# Patient Record
Sex: Male | Born: 1990 | Race: White | Hispanic: No | Marital: Single | Smoking: Former smoker
Health system: Southern US, Community
[De-identification: ages and names within clinical notes are randomized; demographics above are authoritative.]

## PROBLEM LIST (undated history)

## (undated) DIAGNOSIS — F259 Schizoaffective disorder, unspecified: Secondary | ICD-10-CM

## (undated) DIAGNOSIS — F319 Bipolar disorder, unspecified: Secondary | ICD-10-CM

## (undated) HISTORY — PX: ROTATOR CUFF REPAIR: SHX139

---
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
## 2009-02-11 ENCOUNTER — Encounter: Admission: RE | Admit: 2009-02-11 | Discharge: 2009-05-07 | Payer: Self-pay | Admitting: Orthopedic Surgery

## 2009-02-28 IMAGING — CR DG SHOULDER 2+V*R*
2 series · 2 of 2 positions shown · non-contrast
Comparison: none

CLINICAL DATA: Right shoulder pain

RIGHT SHOULDER - 2 VIEW

[w shoulder ap external right]
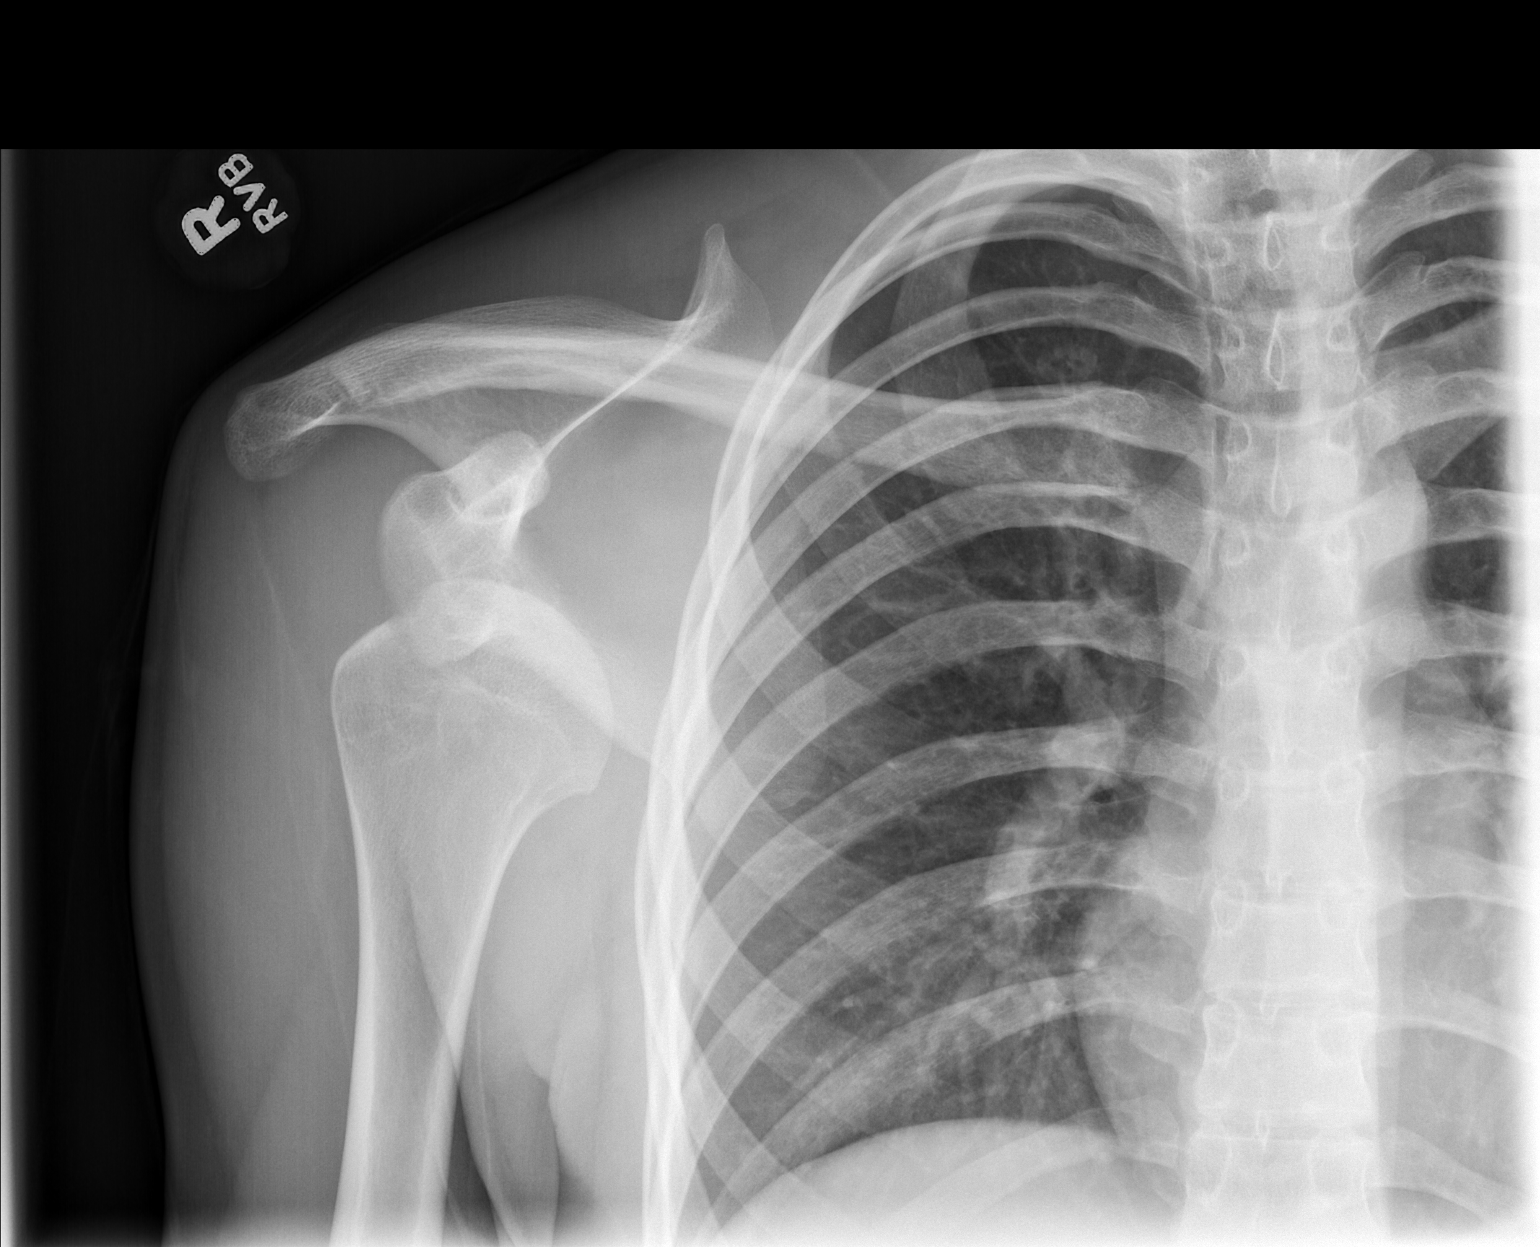

[w shoulder y view right *]
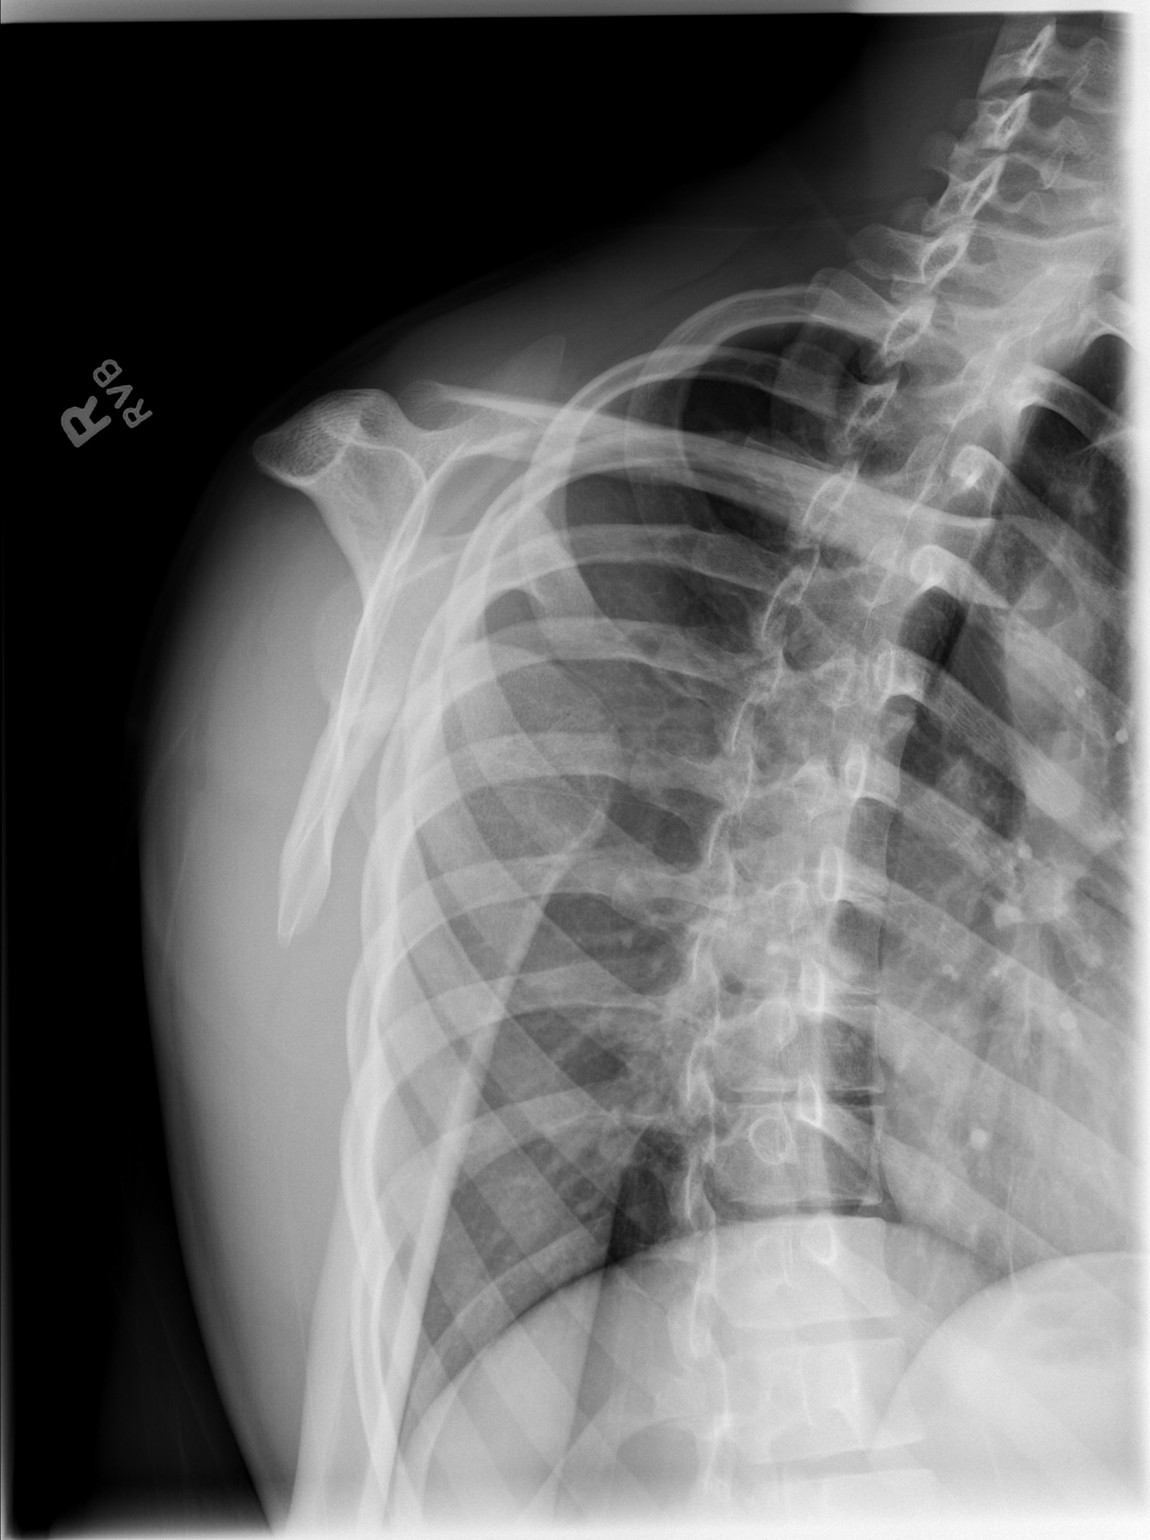

[2 of 2 positions shown; findings below may reference images not displayed]

FINDINGS: There is anterior dislocation of the right humeral head. No acute
fracture visualized.

IMPRESSION

Anterior shoulder dislocation.

## 2009-04-13 ENCOUNTER — Encounter (INDEPENDENT_AMBULATORY_CARE_PROVIDER_SITE_OTHER): Payer: Self-pay | Admitting: Nurse Practitioner

## 2009-05-20 ENCOUNTER — Encounter (INDEPENDENT_AMBULATORY_CARE_PROVIDER_SITE_OTHER): Payer: Self-pay | Admitting: Nurse Practitioner

## 2009-05-27 ENCOUNTER — Encounter (INDEPENDENT_AMBULATORY_CARE_PROVIDER_SITE_OTHER): Payer: Self-pay | Admitting: Nurse Practitioner

## 2009-09-16 ENCOUNTER — Encounter (INDEPENDENT_AMBULATORY_CARE_PROVIDER_SITE_OTHER): Payer: Self-pay | Admitting: Nurse Practitioner

## 2010-01-23 ENCOUNTER — Encounter (INDEPENDENT_AMBULATORY_CARE_PROVIDER_SITE_OTHER): Payer: Self-pay | Admitting: Internal Medicine

## 2010-02-08 ENCOUNTER — Ambulatory Visit: Payer: Self-pay | Admitting: Nurse Practitioner

## 2010-02-08 DIAGNOSIS — F17200 Nicotine dependence, unspecified, uncomplicated: Secondary | ICD-10-CM

## 2010-02-08 LAB — CONVERTED CEMR LAB
Basophils Absolute: 0 10*3/uL (ref 0.0–0.1)
Basophils Relative: 1 % (ref 0–1)
Chlamydia, Swab/Urine, PCR: NEGATIVE
Eosinophils Absolute: 0.1 10*3/uL (ref 0.0–0.7)
GC Probe Amp, Urine: NEGATIVE
Hemoglobin: 15.6 g/dL (ref 13.0–17.0)
Lymphocytes Relative: 43 % (ref 12–46)
Monocytes Relative: 8 % (ref 3–12)
Nitrite: NEGATIVE
Platelets: 270 10*3/uL (ref 150–400)
RBC: 5.11 M/uL (ref 4.22–5.81)
RDW: 12.3 % (ref 11.5–15.5)
Specific Gravity, Urine: >=1.03
WBC Urine, dipstick: NEGATIVE
pH: 5

## 2010-02-10 ENCOUNTER — Encounter (INDEPENDENT_AMBULATORY_CARE_PROVIDER_SITE_OTHER): Payer: Self-pay | Admitting: Nurse Practitioner

## 2010-02-23 ENCOUNTER — Encounter (INDEPENDENT_AMBULATORY_CARE_PROVIDER_SITE_OTHER): Payer: Self-pay | Admitting: Nurse Practitioner

## 2010-08-17 NOTE — Miscellaneous (Signed)
Summary: old record review  Clinical Lists Changes  Observations: Added new observation of PAST SURG HX: 1.  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  [facility], Dr. Neta Ehlers (01/23/2010 13:12)        Past History:  Past Surgical History: 1.  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  [facility], Dr. Neta Ehlers

## 2010-08-17 NOTE — Letter (Signed)
Summary: *HSN Results Follow up  [facility]  [address] [place], [place] [address]   Phone: [phone]  Fax: [phone]      02/10/2010   Kevin Vance [address] [place], [place]  [address]   Dear  [name],                            ____S.Drinkard,FNP   ____D. Gore,FNP       ____B. McPherson,MD   ____V. Rankins,MD    ____E. Mulberry,MD    __X__N. Daphine Deutscher, FNP  ____D. Reche Dixon, MD    ____K. Philipp Deputy, MD    ____Other     This letter is to inform you that your recent test(s):  _______Pap Smear    ___X____Lab Test     _______X-ray    ___X____ is within acceptable limits  _______ requires a medication change  _______ requires a follow-up lab visit  _______ requires a follow-up visit with your provider   Comments: Labs done during recent office visit are normal.       _________________________________________________________ If you have any questions, please contact our office [phone].                    Sincerely,    Lehman Prom FNP [facility]

## 2010-08-17 NOTE — Letter (Signed)
Summary: [facility]   Imported By: Arta Bruce 09/16/2009 11:42:17  _____________________________________________________________________  External Attachment:    Type:   Image     Comment:   External Document

## 2010-08-17 NOTE — Progress Notes (Signed)
Summary: Office Visit//DEPRESSION SCREENING  Office Visit//DEPRESSION SCREENING   Imported By: Arta Bruce 02/08/2010 16:41:36  _____________________________________________________________________  External Attachment:    Type:   Image     Comment:   External Document

## 2010-08-17 NOTE — Letter (Signed)
[facility]   Imported By: Arta Bruce 03/03/2010 14:58:48  _____________________________________________________________________  External Attachment:    Type:   Image     Comment:   External Document

## 2010-08-17 NOTE — Assessment & Plan Note (Signed)
Summary: Complete Physical Exam   Vital Signs:  Patient profile:   20 year old male Weight:      170 pounds BMI:     26.52 Temp:     98.0 degrees F Pulse rate:   84 / minute Pulse rhythm:   regular Resp:     18 per minute BP sitting:   119 / 76  (left arm) Cuff size:   regular  Vitals Entered By: Vesta Mixer CMA (February 08, 2010 10:03 AM)  Nutrition Counseling: Patient's BMI is greater than 25 and therefore counseled on weight management options. CC: CPE Is Patient Diabetic? No  Does patient need assistance? Ambulation Normal   CC:  CPE.  History of Present Illness:  Pt into the office for a complete physical exam.   ADHD/Bipolar - ongoing care with [facility]. Pt is taking medication from that office including Ability and lexapro.  Also one additional medication which the pt does not know the name of.  Optho - no glasses or contacts. No recent eye exam  Dental - Pt is wearing braces.  ongoing f/u with the orthodontist  Mother transported pt here today in office  Habits & Providers  Alcohol-Tobacco-Diet     Alcohol drinks/day: 1     Alcohol Counseling: to decrease amount and/or frequency of alcohol intake     Tobacco Status: current     Tobacco Counseling: to quit use of tobacco products     Cigarette Packs/Day: <0.25     Year Started: age 2  Exercise-Depression-Behavior     Does Patient Exercise: no     Have you felt down or hopeless? no     Have you felt little pleasure in things? no     Depression Counseling: not indicated; screening negative for depression     Drug Use: past  Comments: PHQ-9 score = 0  History     General health:   Nl     Complaints:     N     Allergies:     N     Meds:     Y     Significant PMH:   Y     Exercise:     N     School:     Ab     Job:       Y     Family changes:    Y      Additional Comments:   Pt has his own place now though he still relies on his mother for assistance.  Pt seems very happy that he is  living on his own.     Development/School Performance     Do you ever feel depressed & down?       yes     Have you ever thought of hurting yourself?   no     Do you feel you will be successful?     no     Do you own a gun?     Has anyone ever tried to hurt you?     no      Have you become sexually active?     yes     Do you use birth control?       yes     Have you ever contracted an     STD such as chlamydia, herpes?     no     Are you living away from home?     yes  Are you satisfied with job/school?     no  Anticipatory Guidance Reviewed the following topics: Use seatbelts/follow speed limits, Counseling avoiding tobacco/alcohol/smokeless tobacco, Recognize & deal with stress/S/S depression, Limit fat/chol. intake; eat more grains fruits & veg; adequate calcium/iron-females Brush teeth-floss-see dentist, *Educate yourself about birth control/STDs, Sexuality education--safety-saying NO/abstinence/homosexuality  Screenings     Vision screen:   Normal     Hearing screen:   Normal     Oral screening:   NI     HIV test:     Neg  Allergies (verified): No Known Drug Allergies  Social History: Smoking Status:  current Drug Use:  past Packs/Day:  <0.25 Does Patient Exercise:  no  Review of Systems General:  Denies fever. Eyes:  Denies discharge. ENT:  Denies earache. CV:  Denies chest pain or discomfort. Resp:  Denies cough. GI:  Denies abdominal pain, nausea, and vomiting. GU:  Denies dysuria. MS:  Complains of joint pain; right shoulder - s/p surger x 2 since his last visit.  Last surgery per pt report was before christmas.  Doing well.  no problems with shoulder since that time.  . Derm:  Denies rash. Neuro:  Denies headaches. Psych:  Denies anxiety and depression.  Physical Exam  General:  alert.   Head:  normocephalic.   Eyes:  pupils equal and pupils round.   Ears:  minimal cerumen Nose:  no nasal discharge.   Mouth:  bracespharynx pink and moist.   Neck:   supple.   Chest Wall:  no masses.   Breasts:  no gynecomastia.   Lungs:  normal breath sounds.   Heart:  normal rate and regular rhythm.   Abdomen:  soft, non-tender, and normal bowel sounds.   Rectal:  defer Genitalia:  circumcised.  no inguinal hernia no scrotal masses Prostate:  defer Msk:  right shoulder scar active ROM Pulses:  R radial normal and L radial normal.   Extremities:  no edema Neurologic:  alert & oriented X3 and gait normal.   Skin:  color normal.   Psych:  Oriented X3.     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 1 spray in each nostril two times a day 2)  Abilify 5 Mg Tabs (Aripiprazole) .... One tablet by mouth daily 3)  Lexapro 10 Mg Tabs (Escitalopram oxalate) .... One tablet by mouth daily  Patient Instructions: 1)  You will be informed of any abnormal labs. 2)  You are doing well. Keep up the good work  Laboratory Results   Urine Tests  Date/Time Received: February 08, 2010 10:51 AM   Routine Urinalysis   Glucose: negative   (Normal Range: Negative) Bilirubin: small   (Normal Range: Negative) Ketone: negative   (Normal Range: Negative) Spec. Gravity: >=1.030   (Normal Range: 1.003-1.035) Blood: negative   (Normal Range: Negative) pH: 5.0   (Normal Range: 5.0-8.0) Protein: negative   (Normal Range: Negative) Urobilinogen: 0.2   (  Normal Range:  0-1) Nitrite: negative   (Normal Range: Negative) Leukocyte Esterace: negative   (Normal Range: Negative)    Date/Time Received: February 08, 2010   Other Tests  Rapid HIV: negative

## 2010-11-30 NOTE — Op Note (Signed)
NAME:  Kevin Vance, Kevin Vance NO.:  [id number]   MEDICAL RECORD NO.:  [id number]          PATIENT TYPE:  EMS   LOCATION:  ED                           FACILITY:  [facility]   PHYSICIAN:  Dyke Brackett, M.D.    DATE OF BIRTH:  11/21/1990   DATE OF PROCEDURE:  DATE OF DISCHARGE:  02/23/2007                               OPERATIVE REPORT   INDICATION:  A 20 year old with recurrent anterior and inferior  stability thought to be amenable to outpatient surgery.   PREOPERATIVE DIAGNOSIS:  Anterior and inferior instability, right  shoulder.   POSTOPERATIVE DIAGNOSIS:  Anterior and inferior instability, right  shoulder with bucket handle superior labrum anterior to posterior  lesion.   OPERATION:  1. Arthroscopic capsulorrhaphy.  2. Debridement of superior labrum anterior to posterior lesion.   SURGEON:  Dyke Brackett, M.D.   ASSISTANT:  Arlys John D. Petrarca, P.A.-C.   BLOOD LOSS:  Minimal.   DESCRIPTION OF PROCEDURE:  On anesthesia, he had frank dislocation of  the anterior-inferior lead with part of the scope to two posterior to  two anterior portals.  Intrarticular inspection of the shoulder showed  him to have a Hill-Sachs lesion and a very large Bankart lesion,  extension of the tear into the superior labrum with a bucket handle  component into the superior labrum at the biceps anchor, which was  debrided, slight extension posteriorly.  The bucket handle portion was  excised.  The capsule was mobilized, followed by debriding of the  glenoid bone.  Three push-lock anchors were inserted with mattress  sutures from inferior to superior.  The technique was to place the  arthroscopic cannulated awl through the larger inferior portal, grasping  it from the superior portal with the fiber stick, delivering that suture  superiorly.  Thereafter, putting the arthroscopic awl back in the larger  anterior portal, followed by placement of a wire with the loop out the  anterior portal,  grasped to the superior portal, delivering then the  second limb of the suture from inferior to superior.  Both superior  sutures were next grasped and delivered inferiorly.  This was followed  by placement of the anchor through a drill hole in the glenoid.  Three  such anchors were placed with basically anatomically repair.  The labral  debridement was carried out separate from this.  The shoulder drained  free of fluid, closed with nylon and a shoulder immobilizer was applied,  and numbed with Marcaine and morphine, taken to the recovery room in  stable condition.     Dyke Brackett, M.D.  Electronically Signed    WDC/MEDQ  D:  03/02/2007  T:  03/02/2007  Job:  [id number]

## 2011-04-29 LAB — POCT HEMOGLOBIN-HEMACUE
Hemoglobin: 14.5
Operator id: 18996

## 2012-08-15 ENCOUNTER — Emergency Department (HOSPITAL_COMMUNITY)
Admission: EM | Admit: 2012-08-15 | Discharge: 2012-08-15 | Disposition: A | Discharge status: Home / Self Care | Payer: Medicaid Other | Attending: Emergency Medicine | Admitting: Emergency Medicine

## 2012-08-15 ENCOUNTER — Encounter (HOSPITAL_COMMUNITY): Payer: Self-pay | Admitting: Emergency Medicine

## 2012-08-15 ENCOUNTER — Emergency Department (HOSPITAL_COMMUNITY): Payer: Medicaid Other

## 2012-08-15 DIAGNOSIS — N5089 Other specified disorders of the male genital organs: Secondary | ICD-10-CM

## 2012-08-15 DIAGNOSIS — Z79899 Other long term (current) drug therapy: Secondary | ICD-10-CM | POA: Insufficient documentation

## 2012-08-15 DIAGNOSIS — F319 Bipolar disorder, unspecified: Secondary | ICD-10-CM | POA: Insufficient documentation

## 2012-08-15 DIAGNOSIS — F17200 Nicotine dependence, unspecified, uncomplicated: Secondary | ICD-10-CM | POA: Insufficient documentation

## 2012-08-15 DIAGNOSIS — F259 Schizoaffective disorder, unspecified: Secondary | ICD-10-CM | POA: Insufficient documentation

## 2012-08-15 HISTORY — DX: Bipolar disorder, unspecified: F31.9

## 2012-08-15 HISTORY — DX: Schizoaffective disorder, unspecified: F25.9

## 2012-08-15 LAB — GC/CHLAMYDIA PROBE AMP: GC Probe RNA: NEGATIVE

## 2012-08-15 MED ORDER — CEPHALEXIN 500 MG PO CAPS: 500.0000 mg | ORAL_CAPSULE | Freq: Four times a day (QID) | ORAL | Status: DC

## 2012-08-15 NOTE — ED Notes (Signed)
Pt states he is having testicular swelling that he noticed yesterday but states it is worse today  Pt states there is no pain but a little itching

## 2012-08-15 NOTE — ED Notes (Signed)
MD at bedside. 

## 2012-08-15 NOTE — ED Provider Notes (Signed)
History     CSN: [id number]  Arrival date & time 08/15/12  0026   First MD Initiated Contact with Patient 08/15/12 0037      Chief Complaint  Patient presents with  . Groin Swelling    (Consider location/radiation/quality/duration/timing/severity/associated sxs/prior treatment) HPI Comments: 22 y/o with hx of one day of gradual onset of persistent swelling of the scrotum - he states that it is non painful and not associated with n/v/d or penile d/c.  He has nor f/c and has not been sexually active in over 6 months.  The swelling is persistent, mild to moderate, not associated with tenderness to the scrotum. He does note that he has been exercising more over the last couple of weeks including weight lifting, lots of abdominal sit-ups and crunches and cardiac exercise including basketball. That being said he did not notice an association of the swelling with any Valsalva maneuvers.  Your members having similar swelling when he was 22 years old but does not remember the outcome, it was nonsurgical, he did not have a torsion per his report but did go on antibiotics at that time.  The history is provided by the patient.    Past Medical History  Diagnosis Date  . Bipolar 1 disorder   . Schizoaffective disorder     Past Surgical History  Procedure Date  . Rotator cuff repair     History reviewed. No pertinent family history.  History  Substance Use Topics  . Smoking status: Current Some Day Smoker  . Smokeless tobacco: Not on file  . Alcohol Use: No      Review of Systems  Constitutional: Negative for fever and chills.  Respiratory: Negative for shortness of breath.   Gastrointestinal: Negative for nausea, vomiting and abdominal pain.  Genitourinary: Positive for scrotal swelling. Negative for dysuria, hematuria, discharge, penile swelling, difficulty urinating, genital sores and testicular pain.  Musculoskeletal: Negative for back pain.  Skin: Negative for rash.     Allergies  Review of patient's allergies indicates no known allergies.  Home Medications   Current Outpatient Rx  Name  Route  Sig  Dispense  Refill  . AMPHETAMINE-DEXTROAMPHET ER 10 MG PO CP24   Oral   Take 10 mg by mouth daily.         . ARIPIPRAZOLE 30 MG PO TABS   Oral   Take 30 mg by mouth daily.         . BUPROPION HCL ER (XL) 150 MG PO TB24   Oral   Take 150 mg by mouth daily.         . BUPROPION HCL ER (XL) 300 MG PO TB24   Oral   Take 300 mg by mouth daily.         Marland Kitchen ESCITALOPRAM OXALATE 20 MG PO TABS   Oral   Take 20 mg by mouth daily.         . CEPHALEXIN 500 MG PO CAPS   Oral   Take 1 capsule (500 mg total) by mouth 4 (four) times daily.   40 capsule   0     BP 128/74  Pulse 87  Temp 98.3 F (36.8 C) (Oral)  Resp 20  SpO2 98%  Physical Exam  Nursing note and vitals reviewed. Constitutional: He appears well-developed and well-nourished. No distress.  HENT:  Head: Normocephalic and atraumatic.  Mouth/Throat: Oropharynx is clear and moist. No oropharyngeal exudate.  Eyes: Conjunctivae normal and EOM are normal. Pupils are equal, round, and  reactive to light. Right eye exhibits no discharge. Left eye exhibits no discharge. No scleral icterus.  Neck: Normal range of motion. Neck supple. No JVD present. No thyromegaly present.  Cardiovascular: Normal rate, regular rhythm, normal heart sounds and intact distal pulses.  Exam reveals no gallop and no friction rub.   No murmur heard. Pulmonary/Chest: Effort normal and breath sounds normal. No respiratory distress. He has no wheezes. He has no rales.  Abdominal: Soft. Bowel sounds are normal. He exhibits no distension and no mass. There is tenderness ( Mild midabdominal tenderness, associated with positive Carnett's sign).  Genitourinary:       Penis appears normal, is uncircumcised and there is no urethral discharge at the urethral meatus. Bilateral scrotum appears mildly swollen, nontender, no  associated scrotal masses, bilateral testicles are palpated, testicle on the left with mild tenderness but no obvious swelling or fullness, no inguinal hernias palpated  Musculoskeletal: Normal range of motion. He exhibits no edema and no tenderness.  Lymphadenopathy:    He has no cervical adenopathy.  Neurological: He is alert. Coordination normal.  Skin: Skin is warm and dry. No rash noted. No erythema.  Psychiatric: He has a normal mood and affect. His behavior is normal.    ED Course  Procedures (including critical care time)   Labs Reviewed  GC/CHLAMYDIA PROBE AMP   US Scrotum  08/15/2012  *RADIOLOGY REPORT*  Clinical Data: Groin swelling  ULTRASOUND OF SCROTUM  Technique:  Complete ultrasound examination of the testicles, epididymis, and other scrotal structures was performed.  Comparison:  2008 ultrasound  Findings:  Right testis:  Measures 4.9 x 3.0 x 3.3 cm. Normal echogenicity. No focal abnormality.  Color Doppler flow with arterial and venous wave forms documented.  Left testis:  Measures 4.9 x 2.9 x 2.9 cm.  Normal echogenicity. No focal abnormality.  Color Doppler flow with arterial and venous wave forms documented.  Right epididymis:  Normal in size and appearance.  Left epididymis:  Normal in size and appearance.  Hydrocele:  Absent  Varicocele:  Absent  Nonspecific scrotal wall edema/thickening.  IMPRESSION: Nonspecific scrotal thickening/edema, similar to prior. Favored differential includes acute idiopathic scrotal edema, infection/cellulitis, and systemic causes of edema.  Normal testicular echogenicity with color Doppler flow and arterial and venous wave forms documented bilaterally.  Normal sonographic appearance to the epididymides.   Original Report Authenticated By: Jearld Lesch, M.D.    [place] Art/ven Flow Abd Pelv Doppler  08/15/2012  *RADIOLOGY REPORT*  Clinical Data: Groin swelling  ULTRASOUND OF SCROTUM  Technique:  Complete ultrasound examination of the testicles,  epididymis, and other scrotal structures was performed.  Comparison:  2008 ultrasound  Findings:  Right testis:  Measures 4.9 x 3.0 x 3.3 cm. Normal echogenicity. No focal abnormality.  Color Doppler flow with arterial and venous wave forms documented.  Left testis:  Measures 4.9 x 2.9 x 2.9 cm.  Normal echogenicity. No focal abnormality.  Color Doppler flow with arterial and venous wave forms documented.  Right epididymis:  Normal in size and appearance.  Left epididymis:  Normal in size and appearance.  Hydrocele:  Absent  Varicocele:  Absent  Nonspecific scrotal wall edema/thickening.  IMPRESSION: Nonspecific scrotal thickening/edema, similar to prior. Favored differential includes acute idiopathic scrotal edema, infection/cellulitis, and systemic causes of edema.  Normal testicular echogenicity with color Doppler flow and arterial and venous wave forms documented bilaterally.  Normal sonographic appearance to the epididymides.   Original Report Authenticated By: Jearld Lesch, M.D.  1. Scrotal edema       MDM  The patient has normal vital signs and a benign abdominal exam, he has bilateral scrotal swelling of unknown etiology, will check GC Chlamydia though less likely given no discharge, consider epididymitis, varicocele or hydrocele, less likely torsion. Ultrasound to rule out same   Findings reviewed with the patient, he still appears stable, understands that he needs to see the urologist this week, does not appear to be a reaction to the medications that he is taking, ultrasound read as likely idiopathic scrotal edema as there does not appear to be any signs of acute infection. Vital signs normal, patient advised to stay off his feet, ice packs intermittently, followup with urology this week. Keflex given as a precautionary measure should his symptoms worsen including pain fever or redness.    Vida Roller, MD 08/15/12 [phone]

## 2014-01-21 IMAGING — US US SCROTUM
1 series · 14 of 25 positions shown · non-contrast
Comparison: 5664 ultrasound

CLINICAL DATA: Groin swelling

ULTRASOUND OF SCROTUM
TECHNIQUE: Complete ultrasound examination of the testicles,
epididymis, and other scrotal structures was performed.

[Series 1: us scrotum · 0.07mm/px · 14 of 102 slices shown]
[im 1/102]
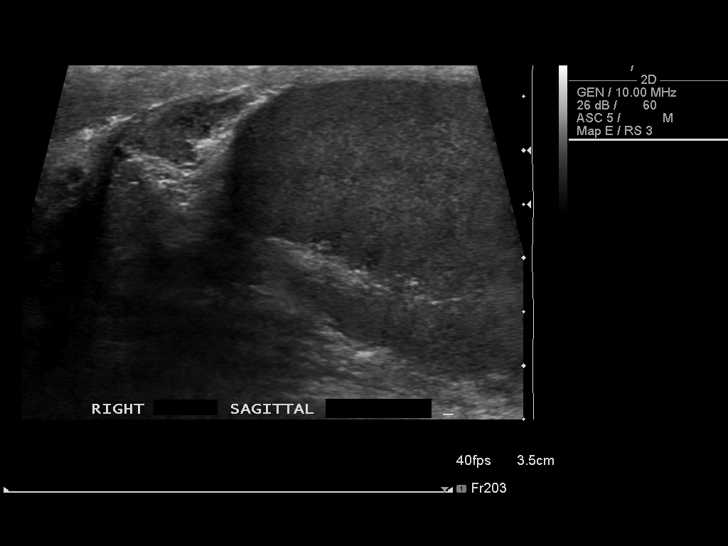
[im 9/102]
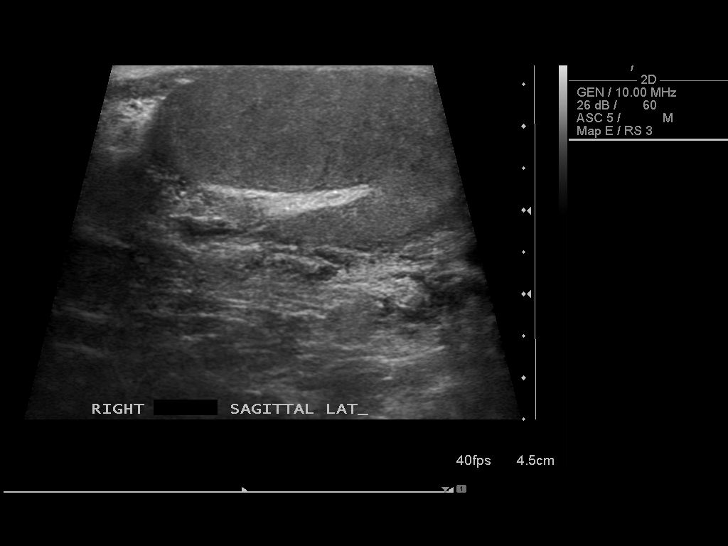
[im 17/102]
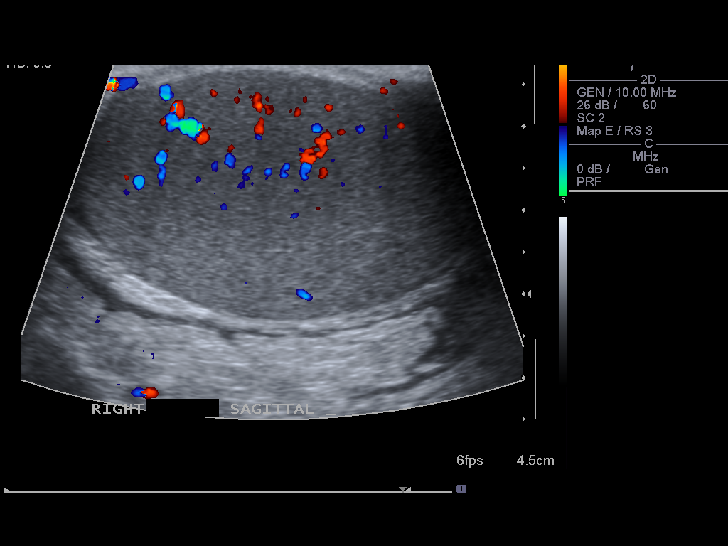
[im 26/102]
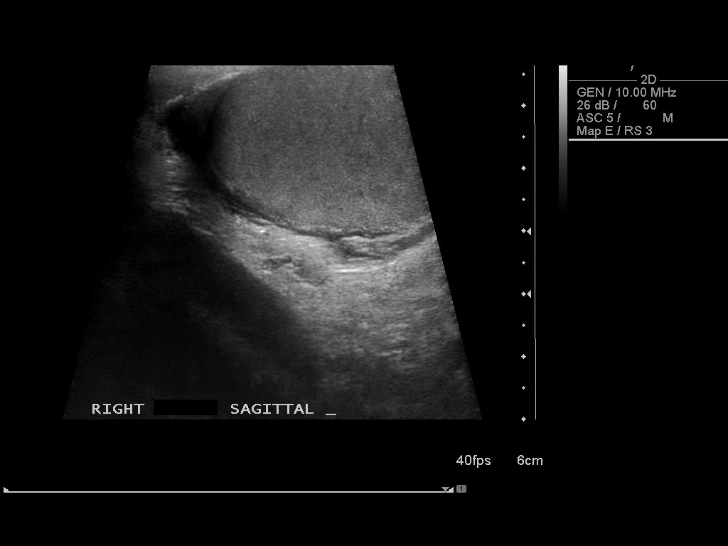
[im 34/102]
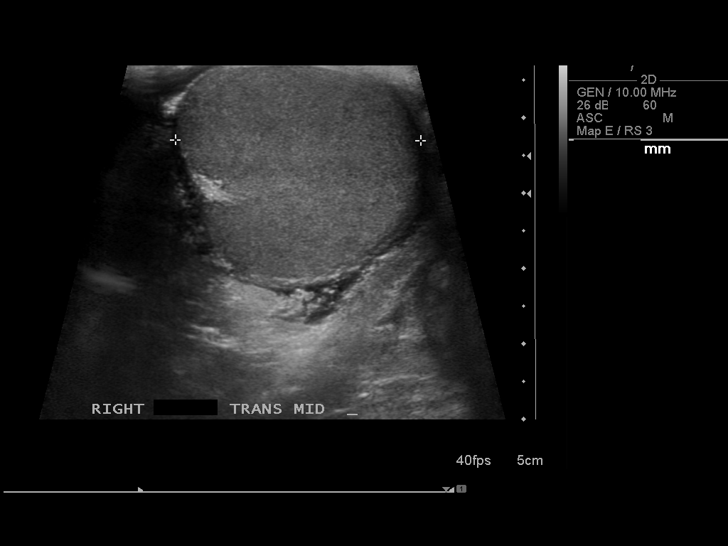
[im 38/102]
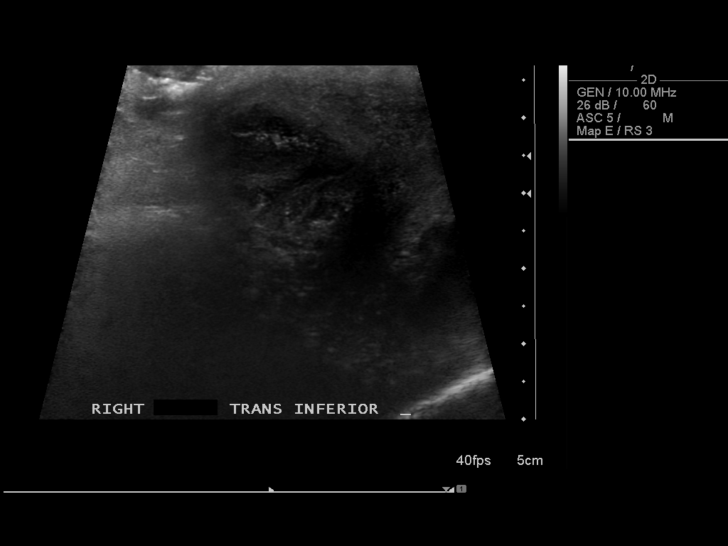
[im 47/102]
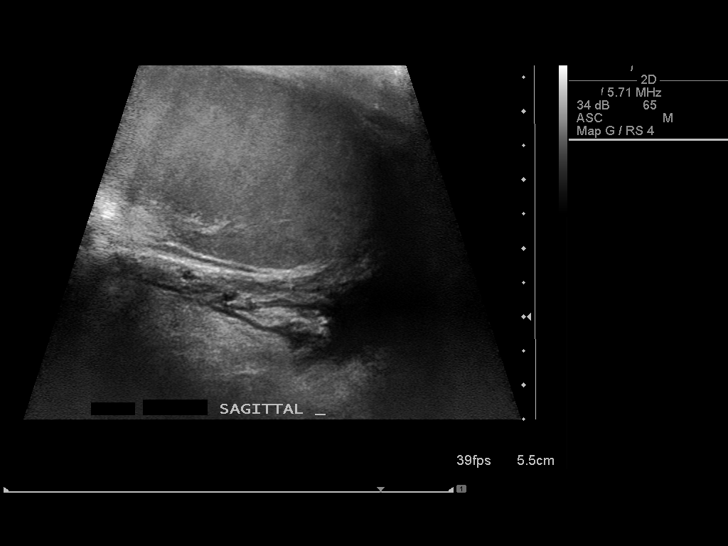
[im 55/102]
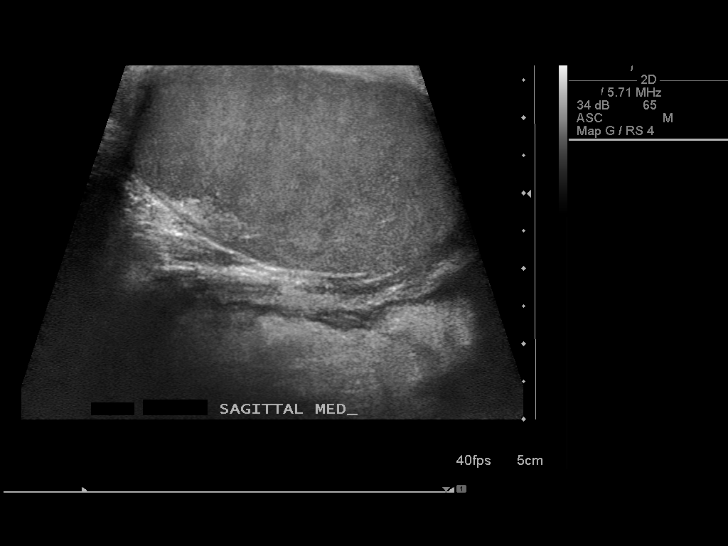
[im 64/102]
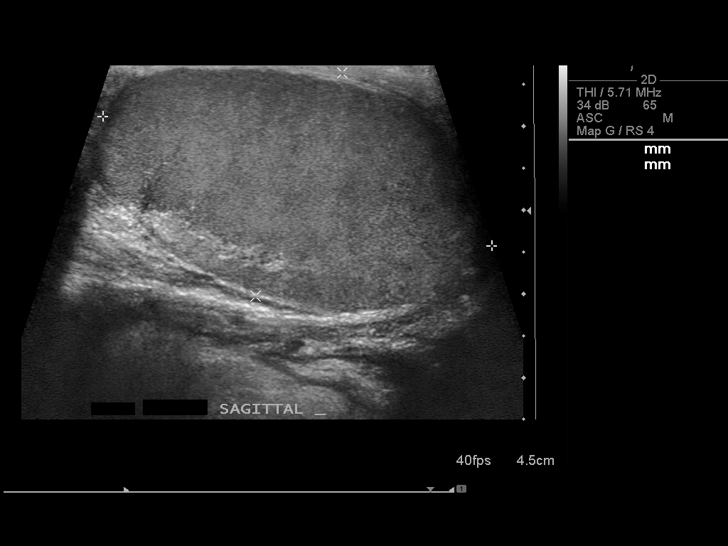
[im 68/102]
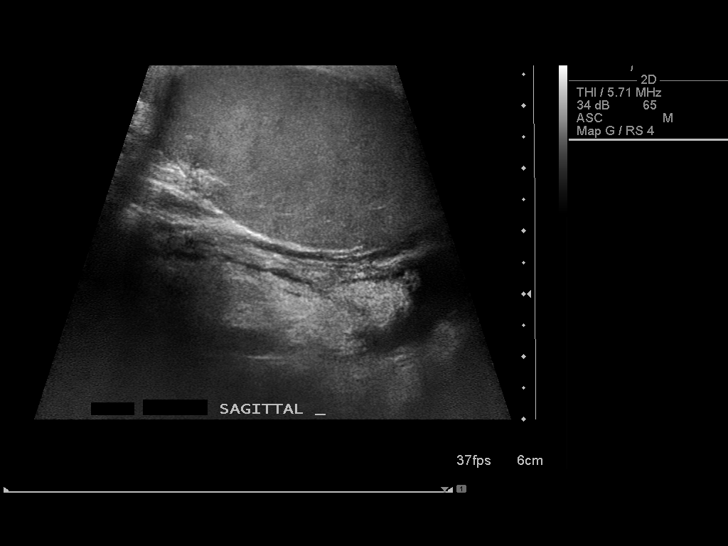
[im 76/102]
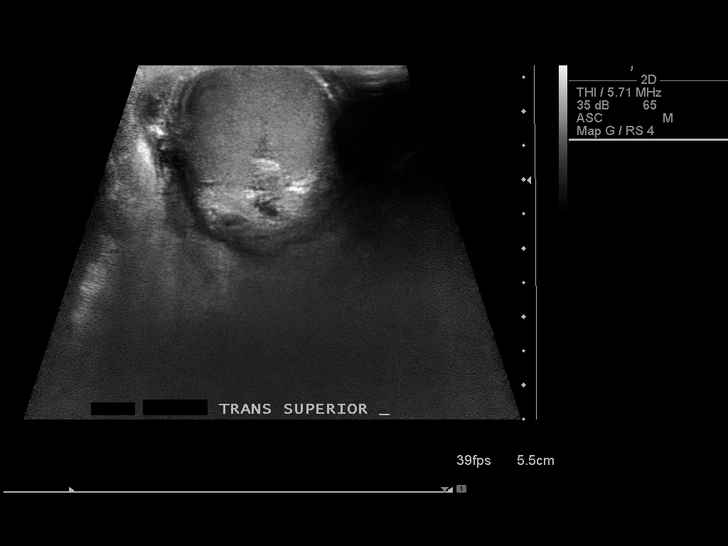
[im 85/102]
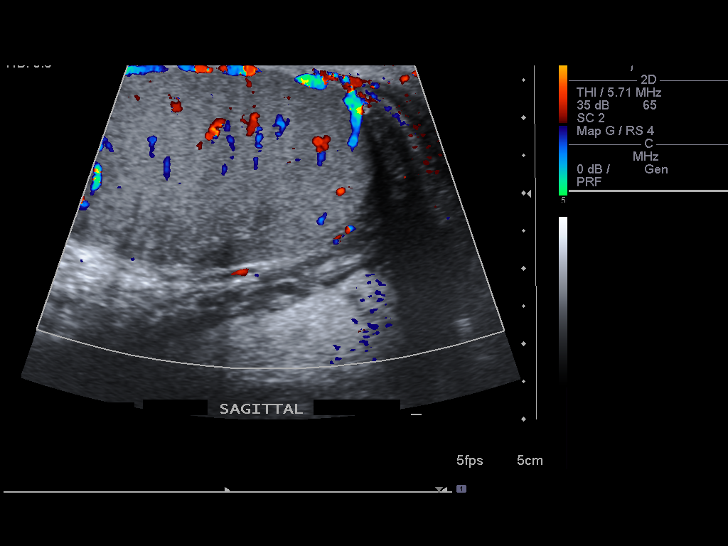
[im 93/102]
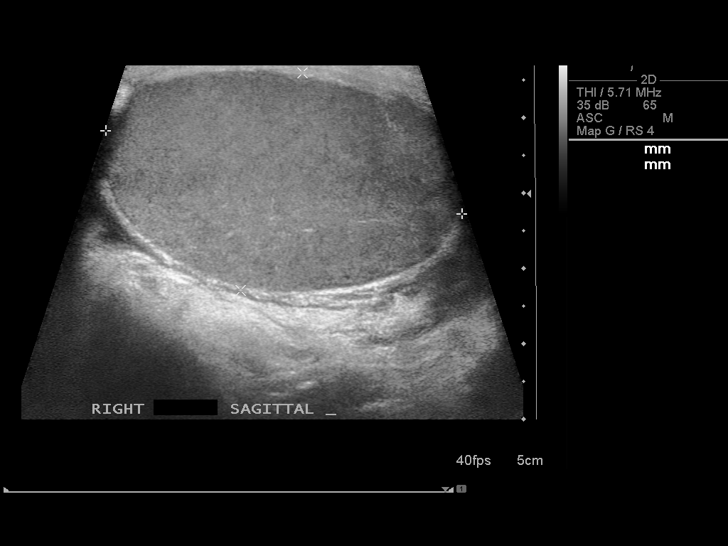
[im 102/102]
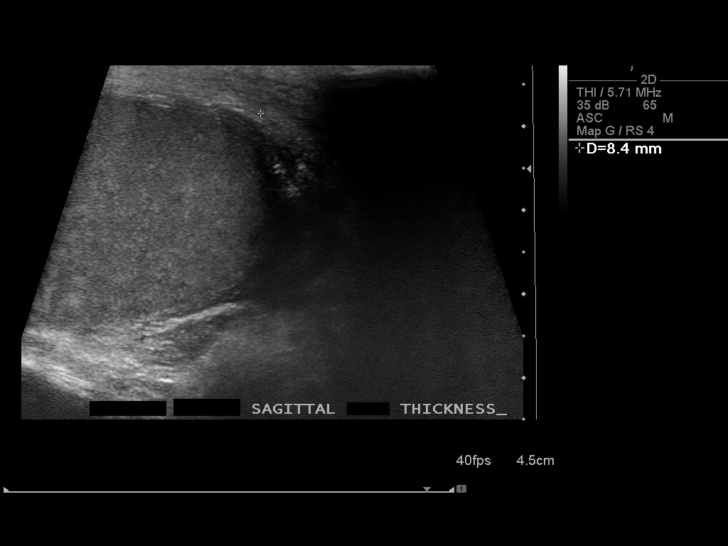

[14 of 25 positions shown; findings below may reference images not displayed]

FINDINGS: Right testis:  Measures 4.9 x 3.0 x 3.3 cm. Normal echogenicity.
No focal abnormality.  Color Doppler flow with arterial and venous
wave forms documented.

Left testis:  Measures 4.9 x 2.9 x 2.9 cm.  Normal echogenicity.
No focal abnormality.  Color Doppler flow with arterial and venous
wave forms documented.

Right epididymis:  Normal in size and appearance.

Left epididymis:  Normal in size and appearance.

Hydrocele:  Absent

Varicocele:  Absent

Nonspecific scrotal wall edema/thickening.
IMPRESSION: Nonspecific scrotal thickening/edema, similar to prior. Favored
differential includes acute idiopathic scrotal edema,
infection/cellulitis, and systemic causes of edema.

Normal testicular echogenicity with color Doppler flow and arterial
and venous wave forms documented bilaterally.

Normal sonographic appearance to the epididymides.

## 2015-12-19 ENCOUNTER — Encounter (HOSPITAL_COMMUNITY): Payer: Self-pay | Admitting: *Deleted

## 2015-12-19 ENCOUNTER — Emergency Department (HOSPITAL_COMMUNITY)
Admission: EM | Admit: 2015-12-19 | Discharge: 2015-12-19 | Disposition: A | Discharge status: Home / Self Care | Payer: Medicaid Other | Attending: Emergency Medicine | Admitting: Emergency Medicine

## 2015-12-19 DIAGNOSIS — Z79899 Other long term (current) drug therapy: Secondary | ICD-10-CM | POA: Diagnosis not present

## 2015-12-19 DIAGNOSIS — F17200 Nicotine dependence, unspecified, uncomplicated: Secondary | ICD-10-CM | POA: Insufficient documentation

## 2015-12-19 DIAGNOSIS — F1994 Other psychoactive substance use, unspecified with psychoactive substance-induced mood disorder: Secondary | ICD-10-CM | POA: Insufficient documentation

## 2015-12-19 DIAGNOSIS — F22 Delusional disorders: Secondary | ICD-10-CM | POA: Diagnosis present

## 2015-12-19 DIAGNOSIS — F319 Bipolar disorder, unspecified: Secondary | ICD-10-CM | POA: Insufficient documentation

## 2015-12-19 LAB — CBC
HCT: 43.9 % (ref 39.0–52.0)
HEMOGLOBIN: 14.8 g/dL (ref 13.0–17.0)
MCH: 30.5 pg (ref 26.0–34.0)
MCHC: 33.7 g/dL (ref 30.0–36.0)
MCV: 90.5 fL (ref 78.0–100.0)
Platelets: 263 10*3/uL (ref 150–400)
RBC: 4.85 MIL/uL (ref 4.22–5.81)
RDW: 12.5 % (ref 11.5–15.5)
WBC: 9.6 10*3/uL (ref 4.0–10.5)

## 2015-12-19 LAB — COMPREHENSIVE METABOLIC PANEL
ALK PHOS: 64 U/L (ref 38–126)
ALT: 37 U/L (ref 17–63)
ANION GAP: 10 (ref 5–15)
AST: 25 U/L (ref 15–41)
Albumin: 4.7 g/dL (ref 3.5–5.0)
BILIRUBIN TOTAL: 1.3 mg/dL — AB (ref 0.3–1.2)
BUN: 16 mg/dL (ref 6–20)
CALCIUM: 9.8 mg/dL (ref 8.9–10.3)
CO2: 27 mmol/L (ref 22–32)
CREATININE: 0.93 mg/dL (ref 0.61–1.24)
Chloride: 99 mmol/L — ABNORMAL LOW (ref 101–111)
Glucose, Bld: 111 mg/dL — ABNORMAL HIGH (ref 65–99)
Potassium: 3.6 mmol/L (ref 3.5–5.1)
Sodium: 136 mmol/L (ref 135–145)
TOTAL PROTEIN: 7.6 g/dL (ref 6.5–8.1)

## 2015-12-19 LAB — ETHANOL

## 2015-12-19 LAB — RAPID URINE DRUG SCREEN, HOSP PERFORMED
Amphetamines: POSITIVE — AB
Barbiturates: NOT DETECTED
Benzodiazepines: POSITIVE — AB
Cocaine: NOT DETECTED
OPIATES: POSITIVE — AB
TETRAHYDROCANNABINOL: POSITIVE — AB

## 2015-12-19 LAB — SALICYLATE LEVEL

## 2015-12-19 LAB — ACETAMINOPHEN LEVEL: Acetaminophen (Tylenol), Serum: <10 ug/mL — ABNORMAL LOW (ref 10–30)

## 2015-12-19 MED ORDER — ONDANSETRON HCL 4 MG PO TABS: 4.0000 mg | ORAL_TABLET | Freq: Three times a day (TID) | ORAL | Status: DC | PRN

## 2015-12-19 MED ORDER — LORAZEPAM 1 MG PO TABS: 1.0000 mg | ORAL_TABLET | Freq: Three times a day (TID) | ORAL | Status: DC | PRN

## 2015-12-19 MED ORDER — ACETAMINOPHEN 325 MG PO TABS: 650.0000 mg | ORAL_TABLET | ORAL | Status: DC | PRN

## 2015-12-19 MED ORDER — NICOTINE 21 MG/24HR TD PT24: 21.0000 mg | MEDICATED_PATCH | Freq: Every day | TRANSDERMAL | Status: DC

## 2015-12-19 NOTE — BH Assessment (Addendum)
Assessment Note  Kevin Vance is an 25 y.o. male presenting voluntarily for a psychiatric evaluation. Patient states "we had a stressful argument, me my mother, and my grandpa." Patient reports that "I want help because I don't see people's point of view on things." Patient reports that he took some of his grandmother's (who is deceased) "pain pills" and his grandfather called him a "thief." Patient reports "she's not here no more so what's the point in you throwing them away when I can enjoy them?" Patient reports that he got into an argument with his mother today regarding the pills and "said hateful things" and he wants to "get help." Patient denies Si and history of previous attempts. Patient denies self injurious behaviors. Patient denies HI but reports that he has history of fighting others in the past year. Patient denies pending charges and upcoming court dates. Patient denies access to firearms or weapons. Patient denies auditory hallucinations but reports that he feels "paranoid." When asked to elaborate on paranoia, patient states "I always think people don't like me and they are out to get me." Patient reports that he has visual hallucinations and describes them as I tthink I might just see something but I blink and it's not there." patient denies hallucinations that are command in nature.  Patient reports that he uses THC but stopped three days ago.  Patient UDS + opiates, + benzodiazepines, + amphetamines, +THC. Patient reports that he took his grandmothers promethazine and that he is prescribed Adderall for bipolar disorder. Patient denies use of alcohol and BAL <5 at time of assessment.   Patient is alert and oriented x4. Patient is assessed in triage and is dressed in scrubs. Patients speak slowly and is logical and calm. Patient reports that he has a psychiatrist at [facility] where he is being treated for '"ipolar and schizophrenia I think." Patient reports that he is prescribed  Adderall by this practice and is scheduled for his next appointment on Monday. Patient reports that he has had one previous admission to "some hospital in [place], [place] I think" in High school but is unable to recall why he was admitted. Patient contracts for safety at time of his assessment.   Consulted with Darlyne Russian, PA-C who states that patient does not meet inpatient criteria and can be discharged with outpatient resources.    Diagnosis: Polysubstance Abuse  Past Medical History:  Past Medical History  Diagnosis Date  . Bipolar 1 disorder ([place])   . Schizoaffective disorder [facility])     Past Surgical History  Procedure Laterality Date  . Rotator cuff repair      Family History: No family history on file.  Social History:  reports that he has been smoking.  He does not have any smokeless tobacco history on file. He reports that he does not drink alcohol or use illicit drugs.  Additional Social History:  Alcohol / Drug Use Pain Medications: See PTA Prescriptions: See PTA Over the Counter: See PTA History of alcohol / drug use?: Yes Substance #1 Name of Substance 1: THC 1 - Age of First Use: 11 1 - Duration: "I quit" 1 - Last Use / Amount: 4 days ago - 1 joint  CIWA: CIWA-Ar BP: 129/69 mmHg Pulse Rate: 82 COWS:    Allergies: No Known Allergies  Home Medications:  (Not in a hospital admission)  OB/GYN Status:  No LMP for male patient.  General Assessment Data Location of Assessment: [facility] ED TTS Assessment: In system Is this a  Tele or Face-to-Face Assessment?: Face-to-Face Is this an Initial Assessment or a Re-assessment for this encounter?: Initial Assessment Marital status: Single Is patient pregnant?: No Pregnancy Status: No Living Arrangements: Parent (mother) Can pt return to current living arrangement?: Yes Admission Status: Voluntary Is patient capable of signing voluntary admission?: Yes Referral Source: Self/Family/Friend     Crisis Care  Plan Living Arrangements: Parent (mother) Name of Psychiatrist: [facility] Name of Therapist: None  Education Status Is patient currently in school?: No Highest grade of school patient has completed: GED  Risk to self with the past 6 months Suicidal Ideation: No Has patient been a risk to self within the past 6 months prior to admission? : No Suicidal Intent: No Has patient had any suicidal intent within the past 6 months prior to admission? : No Is patient at risk for suicide?: No Suicidal Plan?: No Has patient had any suicidal plan within the past 6 months prior to admission? : No Access to Means: No What has been your use of drugs/alcohol within the last 12 months?: THC Previous Attempts/Gestures: No How many times?: 0 Other Self Harm Risks: Denies Triggers for Past Attempts: None known Intentional Self Injurious Behavior: None Family Suicide History: No Recent stressful life event(s): Conflict (Comment) (with mother) Persecutory voices/beliefs?: No Depression: Yes Depression Symptoms: Tearfulness, Isolating, Fatigue, Loss of interest in usual pleasures, Feeling worthless/self pity Substance abuse history and/or treatment for substance abuse?: Yes Suicide prevention information given to non-admitted patients: Not applicable  Risk to Others within the past 6 months Homicidal Ideation: No Does patient have any lifetime risk of violence toward others beyond the six months prior to admission? : No Thoughts of Harm to Others: No Current Homicidal Intent: No Current Homicidal Plan: No Access to Homicidal Means: No Identified Victim: Denies History of harm to others?: Yes Assessment of Violence: In past 6-12 months Violent Behavior Description: Fighting Does patient have access to weapons?: No Criminal Charges Pending?: No Does patient have a court date: No Is patient on probation?: No  Psychosis Hallucinations: None noted ("I'll glance and I thought I saw  something") Delusions: None noted  Mental Status Report Appearance/Hygiene: In scrubs Eye Contact: Fair Motor Activity: Unable to assess Speech: Logical/coherent, Slow Level of Consciousness: Alert Mood: Pleasant Anxiety Level: None Thought Processes: Coherent, Relevant Judgement: Partial Orientation: Person, Place, Time, Situation, Appropriate for developmental age Obsessive Compulsive Thoughts/Behaviors: None  Cognitive Functioning Concentration: Decreased Memory: Recent Intact, Remote Intact IQ: Average Insight: Fair Impulse Control: Fair Appetite: Good Sleep: No Change Total Hours of Sleep: 8 Vegetative Symptoms: None  ADLScreening [facility] Assessment Services) Patient's cognitive ability adequate to safely complete daily activities?: Yes Patient able to express need for assistance with ADLs?: Yes Independently performs ADLs?: Yes (appropriate for developmental age)  Prior Inpatient Therapy Prior Inpatient Therapy: Yes Prior Therapy Dates: 2010 Prior Therapy Facilty/Provider(s): [place] Reason for Treatment: [facility]  Prior Outpatient Therapy Prior Outpatient Therapy: Yes Prior Therapy Dates: Present (about 6 or 7 years, next appointment Monday) Prior Therapy Facilty/Provider(s): [facility]  Reason for Treatment: Schizoaffective Disorder Does patient have an ACCT team?: No Does patient have Intensive In-House Services?  : No Does patient have [facility] services? : No Does patient have P4CC services?: No  ADL Screening (condition at time of admission) Patient's cognitive ability adequate to safely complete daily activities?: Yes Is the patient deaf or have difficulty hearing?: No Does the patient have difficulty seeing, even when wearing glasses/contacts?: No Does the patient have difficulty concentrating, remembering, or  making decisions?: No Patient able to express need for assistance with ADLs?: Yes Does the patient have difficulty dressing or bathing?:  No Independently performs ADLs?: Yes (appropriate for developmental age) Does the patient have difficulty walking or climbing stairs?: No Weakness of Legs: None Weakness of Arms/Hands: None  Home Assistive Devices/Equipment Home Assistive Devices/Equipment: None    Abuse/Neglect Assessment (Assessment to be complete while patient is alone) Physical Abuse: Denies Verbal Abuse: Denies Sexual Abuse: Denies Exploitation of patient/patient's resources: Denies Self-Neglect: Denies Values / Beliefs Cultural Requests During Hospitalization: None Spiritual Requests During Hospitalization: Religious literature (requested a Bible) Consults Spiritual Care Consult Needed: No Social Work Consult Needed: No Regulatory affairs officer (For Healthcare) Does patient have an advance directive?: No Would patient like information on creating an advanced directive?: No - patient declined information    Additional Information 1:1 In Past 12 Months?: No CIRT Risk: No Elopement Risk: No Does patient have medical clearance?: Yes     Disposition:  Disposition Initial Assessment Completed for this Encounter: Yes Disposition of Patient: Other dispositions (substance abuse referrals) Other disposition(s): Other (Comment) (per Darlyne Russian, PA-C)  On Site Evaluation by:   Reviewed with Physician:    Jaymin Waln 12/19/2015 5:34 AM

## 2015-12-19 NOTE — ED Notes (Signed)
TTS to the bedside.

## 2015-12-19 NOTE — ED Notes (Signed)
Pt states he is here for "depression, stress and paranoia." Pt says that sometimes he looks at things and see's things that really are not there, like "someone with a gun or a snake or something". Symptoms worse over a week, not taking meds like he should over the past week because "in the long run its going to kill me quicker, you know, the long term effect"

## 2015-12-19 NOTE — ED Provider Notes (Signed)
CSN: [id number]     Arrival date & time 12/19/15  0219 History   First MD Initiated Contact with Patient 12/19/15 0258     Chief Complaint  Patient presents with  . Paranoid     (Consider location/radiation/quality/duration/timing/severity/associated sxs/prior Treatment) HPI Comments: Patient with hx of bipolar 1 d/o and schizoaffective disorder. He presents to the ED for worsening paranoia and delusions. He states that he has always been paranoid. He also reports seeing things for a few seconds that may not actually be present. Visual hallucinations are sometimes associated with use of marijuana. Patient denies other illicit drug use. No ETOH use recently. He denies SI/HI. He has a hx of [facility] hospitalization 7-8 years ago, per patient. He reports medication noncompliance in triage. He is requesting milk and a bible.  The history is provided by the patient. No language interpreter was used.    Past Medical History  Diagnosis Date  . Bipolar 1 disorder ([place])   . Schizoaffective disorder [facility])    Past Surgical History  Procedure Laterality Date  . Rotator cuff repair     No family history on file. Social History  Substance Use Topics  . Smoking status: Current Some Day Smoker  . Smokeless tobacco: None  . Alcohol Use: No    Review of Systems  Psychiatric/Behavioral: Positive for hallucinations and behavioral problems. Negative for suicidal ideas. The patient is nervous/anxious.   All other systems reviewed and are negative.   Allergies  Review of patient's allergies indicates no known allergies.  Home Medications   Prior to Admission medications   Medication Sig Start Date End Date Taking? Authorizing Provider  amphetamine-dextroamphetamine (ADDERALL XR) 10 MG 24 hr capsule Take 10 mg by mouth daily.   Yes Historical Provider, MD  ARIPiprazole (ABILIFY) 30 MG tablet Take 30 mg by mouth daily.   Yes Historical Provider, MD  buPROPion (WELLBUTRIN XL) 300 MG 24 hr tablet Take  300 mg by mouth daily.   Yes Historical Provider, MD  escitalopram (LEXAPRO) 20 MG tablet Take 20 mg by mouth daily.   Yes Historical Provider, MD   BP 129/69 mmHg  Pulse 82  Temp(Src) 97.8 F (36.6 C) (Oral)  Resp 16  Ht 5\' 9"  (1.753 m)  Wt 86.183 kg  BMI 28.05 kg/m2  SpO2 95%   Physical Exam  Constitutional: He is oriented to person, place, and time. He appears well-developed and well-nourished. No distress.  HENT:  Head: Normocephalic and atraumatic.  Eyes: Conjunctivae and EOM are normal. No scleral icterus.  Neck: Normal range of motion.  Pulmonary/Chest: Effort normal. No respiratory distress.  Musculoskeletal: Normal range of motion.  Neurological: He is alert and oriented to person, place, and time.  Skin: Skin is warm and dry. No rash noted. He is not diaphoretic. No erythema. No pallor.  Psychiatric: He has a normal mood and affect. His speech is normal and behavior is normal. He expresses no homicidal and no suicidal ideation.  Nursing note and vitals reviewed.   ED Course  Procedures (including critical care time) Labs Review Labs Reviewed  COMPREHENSIVE METABOLIC PANEL - Abnormal; Notable for the following:    Chloride 99 (*)    Glucose, Bld 111 (*)    Total Bilirubin 1.3 (*)    All other components within normal limits  ACETAMINOPHEN LEVEL - Abnormal; Notable for the following:    Acetaminophen (Tylenol), Serum <10 (*)    All other components within normal limits  URINE RAPID DRUG SCREEN, HOSP PERFORMED -  Abnormal; Notable for the following:    Opiates POSITIVE (*)    Benzodiazepines POSITIVE (*)    Amphetamines POSITIVE (*)    Tetrahydrocannabinol POSITIVE (*)    All other components within normal limits  ETHANOL  SALICYLATE LEVEL  CBC    Imaging Review No results found.   I have personally reviewed and evaluated these images and lab results as part of my medical decision-making.   EKG Interpretation None      MDM   Final diagnoses:   Substance induced mood disorder ([place])    Patient medically cleared and evaluated by TTS. He does not meet criteria for inpatient management. TTS deems the patient appropriate for discharge. Patient given resource guide and return precautions. Patient discharged from the emergency department in satisfactory condition; VSS.   Filed Vitals:   12/19/15 0228 12/19/15 0347  BP: 137/84 129/69  Pulse: 93 82  Temp: 97.8 F (36.6 C)   TempSrc: Oral   Resp: 16 16  Height: 5\' 9"  (1.753 m)   Weight: 86.183 kg   SpO2: 97% 95%     Antonietta Breach, PA-C 12/19/15 Clarinda Yao, MD 12/19/15 [phone]

## 2015-12-19 NOTE — ED Notes (Signed)
Patient denies SI, HI and AVH at this time. Plan of care discussed with patient. Patient voices no complaints or concerns at this time. Encouragement and support provided and safety maintain. Q 15 min safety checks  in place.

## 2015-12-19 NOTE — ED Notes (Signed)
Pt changed into scrubs, wanded by security, updated on plan of care, given meal and milk per request.

## 2015-12-19 NOTE — BH Assessment (Signed)
Counselor explained treatment recommendations to pt and provided him with a list of substance abuse resources.

## 2015-12-19 NOTE — Discharge Instructions (Signed)
Polysubstance Abuse When people abuse more than one drug or type of drug it is called polysubstance or polydrug abuse. For example, many smokers also drink alcohol. This is one form of polydrug abuse. Polydrug abuse also refers to the use of a drug to counteract an unpleasant effect produced by another drug. It may also be used to help with withdrawal from another drug. People who take stimulants may become agitated. Sometimes this agitation is countered with a tranquilizer. This helps protect against the unpleasant side effects. Polydrug abuse also refers to the use of different drugs at the same time.  Anytime drug use is interfering with normal living activities, it has become abuse. This includes problems with family and friends. Psychological dependence has developed when your mind tells you that the drug is needed. This is usually followed by physical dependence which has developed when continuing increases of drug are required to get the same feeling or "high". This is known as addiction or chemical dependency. A person's risk is much higher if there is a history of chemical dependency in the family. SIGNS OF CHEMICAL DEPENDENCY  You have been told by friends or family that drugs have become a problem.  You fight when using drugs.  You are having blackouts (not remembering what you do while using).  You feel sick from using drugs but continue using.  You lie about use or amounts of drugs (chemicals) used.  You need chemicals to get you going.  You are suffering in work performance or in school because of drug use.  You get sick from use of drugs but continue to use anyway.  You need drugs to relate to people or feel comfortable in social situations.  You use drugs to forget problems. "Yes" answered to any of the above signs of chemical dependency indicates there are problems. The longer the use of drugs continues, the greater the problems will become. If there is a family history of  drug or alcohol use, it is best not to experiment with these drugs. Continual use leads to tolerance. After tolerance develops more of the drug is needed to get the same feeling. This is followed by addiction. With addiction, drugs become the most important part of life. It becomes more important to take drugs than participate in the other usual activities of life. This includes relating to friends and family. Addiction is followed by dependency. Dependency is a condition where drugs are now needed not just to get high, but to feel normal. Addiction cannot be cured but it can be stopped. This often requires outside help and the care of professionals. Treatment centers are listed in the yellow pages under: Cocaine, Narcotics, and Alcoholics Anonymous. Most hospitals and clinics can refer you to a specialized care center. Talk to your caregiver if you need help.   This information is not intended to replace advice given to you by your health care provider. Make sure you discuss any questions you have with your health care provider.   Document Released: 02/23/2005 Document Revised: 09/26/2011 Document Reviewed: 07/09/2014 Elsevier Interactive Patient Education 2016 [facility]stance [facility] Adult The United Ways 211 is a great source of information about community services available.  Access by dialing 2-1-1 from anywhere in [place], or by website -  CustodianSupply.fi.   Other Local Resources (Updated 07/2015)  [facility]  Crisis Hotline, available 24 hours  a day, 7 days a week: [place], [place]   [facility]  Crisis Hotline, available 24 hours a day, 7 days a week: [place], [place]  [facility]  Suicide Prevention Hotline, available 24 hours a day, 7 days a week: [place], [place], available 24 hours a day, 7 days a week: [place], [place] Access to [employer], available 24 hours a day, 7 days a week: 517-210-4258 All   [facility]  Crisis Hotline, available 24 hours a day, 7 days a week: 865-040-0535 All   Other Local Resources (Updated 07/2015)  Outpatient Counseling/ Substance Abuse Programs  Services     Address and Phone Number  ADS (Alcohol and Drug Services)   Options include Individual counseling, group counseling, intensive outpatient program (several hours a day, several days a week)  Offers depression assessments  Provides methadone maintenance program [phone] [address] [place], [place] partial hospitalization/day treatment and DUI/DWI programs  [employer], private insurance [phone] [phone] [place], [place] [address]  [place] include intensive outpatient program (several hours a day, several days a week), outpatient treatment, DUI/DWI services, family education  Also has some services specifically for [employer] transitional housing  [phone] [address] [place], [place] [address]     [place] Medicare, private pay, and private insurance [phone] [address], [place], [place] [address]  [facility]  Services include individual counseling, substance abuse intensive outpatient program (several hours a day, several days a week), day treatment  Blinda Leatherwood, Medicaid, private insurance [phone] [address], [place], [place] [address]  [facility]   Offers substance abuse intensive outpatient program (several hours a day, several days a week), partial hospitalization program [phone] [address] [place], [place] [address]  [phone] [address] [place], [place] [address]  [phone] [place], [place] [address]  [phone], [place], [place] [address]  [facility]  Individual counseling only  Accepts private insurance only [phone] [address] [place], [place] [address]  [facility]  Methadone maintenance program [id number] [place], [place] [address]  [facility] providing substance abuse and mental health counseling  Accepts Medicaid, Medicare, private insurance  Offers sliding scale for uninsured [phone] [place], [place] in [place] individual counseling, and intensive in-home services [phone] [address] [place], [place] [address]  [facility] individual counseling, family counseling, group therapy, domestic violence counseling, consumer credit counseling  Accepts Medicare, Medicaid, private insurance  Offers sliding scale for uninsured [phone] [address] [place], [place] [address]  [phone] [facility], [address] [place], [place]  [facility]  Offers individual, family and group counseling  3 locations - [place], [place], and [place]  [address] [address] [place], [place] [address]  [address] [place], [place] [address]  [place], [place] [address]  [facility]    Offers psychiatric assessment, 8-week Intensive Outpatient Program (several hours a day, several times a week, daytime or evenings), early recovery group, family Program, medication management  Private pay or private insurance only [phone], or  [place], [place] [address]  [place] individual, couples and family counseling  Accepts Medicaid,  private insurance, and sliding scale for uninsured [phone] [address] [place], [place] [address]  Launa Flight, MD  Individual counseling  Private insurance [phone] [place], [place] [address]  [facility]   Offers assessment, substance abuse treatment, and behavioral health treatment [phone] [place], [place] [address]  [place]  Individual counseling  Accepts private insurance [phone] [place], [place] [address]  [facility]  Individual counseling  Blinda Leatherwood, private insurance [phone] [place], [place] [address]  [place]    Offers intensive outpatient program (several hours a day, several times a week)  Private pay, private insurance [phone] [place], [place]  Individual counseling  Medicare, private insurance [phone] [address], [place], [place] [address]  [place]    Offers intensive outpatient program (several hours a day, several times a week) and partial hospitalization program [phone] [place], [place] [address]  Letta Moynahan, MD  Individual counseling [phone] [address], [place], [place] [address]  [place] counseling to individuals, couples, and families  Accepts Medicare and private insurance; offers sliding scale for uninsured [phone] [place], [place] [address]  [facility]  Christian counseling [phone] [address], [place], [place] [address]  [facility] [employer] crisis counseling, individual counseling, group therapy, in-home therapy, domestic violence services, day treatment, DWI services, Conservation officer, nature (CST),  Assertive Community Treatment Team (ACTT), substance abuse Intensive Outpatient Program (several hours a day, several times a week)  2 locations - [place] and [place], [place] [address]  [phone] [address] [place], [place] [address]  [place] counseling and group therapy  [employer] insurance, [place], [place] [phone] [address] [place], [place]  [facility]  Offers individual and family counseling  Offers LGBTQ services  Accepts private insurance and private pay [phone] [place], [place] [address]  [facility]    Offers individual counseling, group therapy, and outpatient detox  Accepts private insurance [phone] [place], [place] Medicare, private insurance [phone] [address] [place], [place] [address]  [employer]  Individual counseling  Accepts Medicare, private insurance [phone] [address] [place] [address], [place] [address]  [facility] [place] substance abuse Intensive Outpatient Program (several hours a day, several times a week) [phone], or [phone] [place], [place]

## 2015-12-19 NOTE — BH Assessment (Signed)
Assessment completed. Consulted with Darlyne Russian, PA-C who states that patient does not meet inpatient criteria and should be referred to treatment for substance abuse.   Rosalin Hawking, LCSW Therapeutic Triage Specialist [place] 12/19/2015 4:24 AM

## 2016-03-03 ENCOUNTER — Emergency Department (HOSPITAL_COMMUNITY)
Admission: EM | Admit: 2016-03-03 | Discharge: 2016-03-05 | Disposition: A | Discharge status: Home / Self Care | Payer: Medicaid Other | Attending: Emergency Medicine | Admitting: Emergency Medicine

## 2016-03-03 ENCOUNTER — Encounter (HOSPITAL_COMMUNITY): Payer: Self-pay

## 2016-03-03 DIAGNOSIS — Z5181 Encounter for therapeutic drug level monitoring: Secondary | ICD-10-CM | POA: Diagnosis not present

## 2016-03-03 DIAGNOSIS — R454 Irritability and anger: Secondary | ICD-10-CM | POA: Diagnosis not present

## 2016-03-03 DIAGNOSIS — F319 Bipolar disorder, unspecified: Secondary | ICD-10-CM | POA: Insufficient documentation

## 2016-03-03 DIAGNOSIS — Z046 Encounter for general psychiatric examination, requested by authority: Secondary | ICD-10-CM | POA: Diagnosis present

## 2016-03-03 DIAGNOSIS — F259 Schizoaffective disorder, unspecified: Secondary | ICD-10-CM | POA: Insufficient documentation

## 2016-03-03 DIAGNOSIS — F17200 Nicotine dependence, unspecified, uncomplicated: Secondary | ICD-10-CM | POA: Diagnosis not present

## 2016-03-03 LAB — COMPREHENSIVE METABOLIC PANEL
ALK PHOS: 73 U/L (ref 38–126)
ALT: 27 U/L (ref 17–63)
AST: 22 U/L (ref 15–41)
Albumin: 4.9 g/dL (ref 3.5–5.0)
Anion gap: 9 (ref 5–15)
BILIRUBIN TOTAL: 1.1 mg/dL (ref 0.3–1.2)
BUN: 9 mg/dL (ref 6–20)
CO2: 24 mmol/L (ref 22–32)
CREATININE: 0.65 mg/dL (ref 0.61–1.24)
Calcium: 9.5 mg/dL (ref 8.9–10.3)
Chloride: 105 mmol/L (ref 101–111)
GFR calc Af Amer: >60 mL/min (ref >60)
GLUCOSE: 105 mg/dL — AB (ref 65–99)
POTASSIUM: 3.8 mmol/L (ref 3.5–5.1)
SODIUM: 138 mmol/L (ref 135–145)
TOTAL PROTEIN: 8.2 g/dL — AB (ref 6.5–8.1)

## 2016-03-03 LAB — SALICYLATE LEVEL: Salicylate Lvl: <4 mg/dL (ref 2.8–30.0)

## 2016-03-03 LAB — RAPID URINE DRUG SCREEN, HOSP PERFORMED
AMPHETAMINES: NOT DETECTED
BARBITURATES: NOT DETECTED
BENZODIAZEPINES: NOT DETECTED
Cocaine: NOT DETECTED
Opiates: NOT DETECTED
Tetrahydrocannabinol: POSITIVE — AB

## 2016-03-03 LAB — ETHANOL

## 2016-03-03 LAB — CBC
HEMATOCRIT: 43.4 % (ref 39.0–52.0)
Hemoglobin: 15 g/dL (ref 13.0–17.0)
MCH: 30.5 pg (ref 26.0–34.0)
MCHC: 34.6 g/dL (ref 30.0–36.0)
MCV: 88.2 fL (ref 78.0–100.0)
PLATELETS: 281 10*3/uL (ref 150–400)
RBC: 4.92 MIL/uL (ref 4.22–5.81)
RDW: 12 % (ref 11.5–15.5)
WBC: 7.9 10*3/uL (ref 4.0–10.5)

## 2016-03-03 LAB — ACETAMINOPHEN LEVEL: Acetaminophen (Tylenol), Serum: <10 ug/mL — ABNORMAL LOW (ref 10–30)

## 2016-03-03 MED ORDER — LORAZEPAM 1 MG PO TABS: 0.0000 mg | ORAL_TABLET | Freq: Two times a day (BID) | ORAL | Status: DC

## 2016-03-03 MED ORDER — THIAMINE HCL 100 MG/ML IJ SOLN: 100.0000 mg | Freq: Every day | INTRAMUSCULAR | Status: DC

## 2016-03-03 MED ORDER — LORAZEPAM 1 MG PO TABS: 0.0000 mg | ORAL_TABLET | Freq: Four times a day (QID) | ORAL | Status: DC

## 2016-03-03 MED ORDER — VITAMIN B-1 100 MG PO TABS
100.0000 mg | ORAL_TABLET | Freq: Every day | ORAL | Status: DC
  Administered 2016-03-03 – 2016-03-05 (×3): 100 mg via ORAL
  Filled 2016-03-03 (×3): qty 1

## 2016-03-03 NOTE — ED Triage Notes (Signed)
Patient arrives with [employer] under IVC per Mother.  Per IVC, hx bipolar and paranoid schizophrenia, and is not taking medications.  Per IVC, patient is hearing voices and is aggressive and hostile to his Mother and Grandmother.  Anger outbursts.

## 2016-03-03 NOTE — ED Notes (Signed)
Pt presents with violent aggressive behavior exhibited towards the grandmother and mother today.  Mother took out IVC papers on pt.  Pt states, " I went off on my grandmother for no reason."  Denies SI, admits to feeling hopeless.  Pt reports diagnosed with Bipolar DO, and Schizoaffective DO.  A&O x 3, no distress noted. Calm & cooperative at present.  Monitoring for safety, Q 15 min checks in effect.

## 2016-03-03 NOTE — ED Notes (Signed)
Bed: [id number] Expected date:  Expected time:  Means of arrival:  Comments: [employer]

## 2016-03-03 NOTE — Progress Notes (Signed)
Patient listed a having Medicaid without a pcp.  Provider listed on Medicaid card is located at [facility].   EDCM spoke to patient and his mother [name]' at bedside. Renee's phone number [phone]. [name]' reports she gets patient's prescription for the patient.  Patient uses the [facility] [address].   Patient's mother reports patient has been off of his medications for 2 months.   Patient's mother reports patient's psychiatrist Dr. Jobie Quaker  Wanted patient to have a comprehensive analysis as he believes the patient may have high functioning autism or Ashberger's. Patient sitting calmly  in bed eating sandwich. No further EDCM needs at this time.
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
## 2016-03-04 DIAGNOSIS — F319 Bipolar disorder, unspecified: Secondary | ICD-10-CM

## 2016-03-04 MED ORDER — TRAZODONE HCL 50 MG PO TABS: 50.0000 mg | ORAL_TABLET | Freq: Every evening | ORAL | Status: DC | PRN

## 2016-03-04 MED ORDER — OLANZAPINE 5 MG PO TABS
5.0000 mg | ORAL_TABLET | Freq: Every day | ORAL | Status: DC
  Administered 2016-03-04: 5 mg via ORAL
  Filled 2016-03-04: qty 1

## 2016-03-04 NOTE — BH Assessment (Signed)
[facility] Assessment Progress Note  Per Neita Garnet, MD, this pt requires psychiatric hospitalization at this time.  The following facilities have been contacted to seek placement for this pt, with results as noted:  Beds available, information sent, decision pending:  [facility]   At capacity:  [facility] (no high acuity beds) [facility] [place], [place] Triage Specialist [phone]

## 2016-03-04 NOTE — ED Notes (Signed)
Pt signed consent for mother, Kevin Vance [id number], to be given information and to visit. While pts mother was visiting, she made this writer aware that she had various papers/information on pts past treatment and tests that have been done. Mother felt this information would be helpful to our treatment team when looking to/be able place pt in appropriate inpatient facility. This Probation officer spoke with counselor Tonette Bihari and Dr. Parke Poisson about this information and will be looking for any legal documentation, testing results and any information that would benefit treatment team with placement. Mother is NOT pts guardian.
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
## 2016-03-04 NOTE — BH Assessment (Signed)
[facility] Assessment Progress Note 03/04/16: Patient was accepted to [facility] at 19:30

## 2016-03-04 NOTE — ED Notes (Signed)
Pt's mom is visiting with pt. She has brought a thick pile (estimated ten inches) of materials related to her son, which she says includes materials related to his mental health history, which she has told staff includes Asperger's.

## 2016-03-04 NOTE — ED Notes (Signed)
Patient noted sleeping in room. No complaints, stable, in no acute distress. Q15 minute rounds and monitoring via Security Cameras to continue.  
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
## 2016-03-04 NOTE — Progress Notes (Signed)
03/04/16 1354:  Pt was with visitor when LRT went to introduce self.  Victorino Sparrow, LRT/CTRS

## 2016-03-04 NOTE — ED Notes (Signed)
Report to include situation, background, assessment and recommendations from [place]. Patient sleeping, respirations regular and unlabored. Q15 minute rounds and security camera observation to continue.

## 2016-03-05 ENCOUNTER — Inpatient Hospital Stay
Admission: AD | Admit: 2016-03-05 | Discharge: 2016-03-12 | DRG: 885 | Disposition: A | Discharge status: Home / Self Care | Payer: Medicaid Other | Source: Other Acute Inpatient Hospital | Attending: Psychiatry | Admitting: Psychiatry

## 2016-03-05 DIAGNOSIS — F319 Bipolar disorder, unspecified: Secondary | ICD-10-CM | POA: Diagnosis not present

## 2016-03-05 DIAGNOSIS — Z9119 Patient's noncompliance with other medical treatment and regimen: Secondary | ICD-10-CM | POA: Diagnosis not present

## 2016-03-05 DIAGNOSIS — F250 Schizoaffective disorder, bipolar type: Principal | ICD-10-CM | POA: Diagnosis present

## 2016-03-05 DIAGNOSIS — G4700 Insomnia, unspecified: Secondary | ICD-10-CM | POA: Diagnosis present

## 2016-03-05 DIAGNOSIS — Z9889 Other specified postprocedural states: Secondary | ICD-10-CM | POA: Diagnosis not present

## 2016-03-05 DIAGNOSIS — Z9114 Patient's other noncompliance with medication regimen: Secondary | ICD-10-CM | POA: Diagnosis not present

## 2016-03-05 DIAGNOSIS — F22 Delusional disorders: Secondary | ICD-10-CM | POA: Diagnosis present

## 2016-03-05 DIAGNOSIS — F1290 Cannabis use, unspecified, uncomplicated: Secondary | ICD-10-CM | POA: Diagnosis present

## 2016-03-05 DIAGNOSIS — F17200 Nicotine dependence, unspecified, uncomplicated: Secondary | ICD-10-CM | POA: Diagnosis present

## 2016-03-05 DIAGNOSIS — Z91199 Patient's noncompliance with other medical treatment and regimen due to unspecified reason: Secondary | ICD-10-CM

## 2016-03-05 DIAGNOSIS — Z87891 Personal history of nicotine dependence: Secondary | ICD-10-CM | POA: Diagnosis not present

## 2016-03-05 MED ORDER — MAGNESIUM HYDROXIDE 400 MG/5ML PO SUSP: 30.0000 mL | Freq: Every day | ORAL | Status: DC | PRN

## 2016-03-05 MED ORDER — TRAZODONE HCL 50 MG PO TABS
50.0000 mg | ORAL_TABLET | Freq: Every evening | ORAL | Status: DC | PRN
Start: 2016-03-05 — End: 2016-03-08
  Administered 2016-03-06 – 2016-03-08 (×2): 50 mg via ORAL
  Filled 2016-03-05 (×3): qty 1

## 2016-03-05 MED ORDER — ACETAMINOPHEN 325 MG PO TABS: 650.0000 mg | ORAL_TABLET | Freq: Four times a day (QID) | ORAL | Status: DC | PRN

## 2016-03-05 MED ORDER — OLANZAPINE 5 MG PO TABS
5.0000 mg | ORAL_TABLET | Freq: Every day | ORAL | Status: DC
  Administered 2016-03-05: 5 mg via ORAL
  Filled 2016-03-05: qty 1

## 2016-03-05 MED ORDER — ALUM & MAG HYDROXIDE-SIMETH 200-200-20 MG/5ML PO SUSP: 30.0000 mL | ORAL | Status: DC | PRN

## 2016-03-05 NOTE — ED Notes (Signed)
Pts. Kevin Vance being held in Room 35 locker given to his Mother [name] at her request with pts. Permission.

## 2016-03-05 NOTE — ED Notes (Signed)
Report called to Photographer at [facility].

## 2016-03-05 NOTE — ED Notes (Signed)
Patient noted sleeping in room. No complaints, stable, in no acute distress. Q15 minute rounds and monitoring via Security Cameras to continue.  

## 2016-03-05 NOTE — Progress Notes (Signed)
[name], male   DOB: [birth year], 25 y.o.   MRN: [id number] Patient admitted today for visual hallucinations of "people."  Patient currently denies any SI/HI/AVH.  Patient is guarded and forwards little.  Patient denies usage of illegal drugs,  However his UDS is positive for THC.  Patient oriented to unit.  Patient search performed.  No contraband found.

## 2016-03-05 NOTE — Progress Notes (Signed)
Patient bright on approach. He currently denies any SI/ HI/ or AVH. He was medication compliant on shift.  He seemed to interact well with peers. No behavioral issues to report on shift at this time.

## 2016-03-05 NOTE — ED Notes (Signed)
Patient was discharged in care of sheriff for transport to [facility]. Pt was calm/cooperative and in NAD at discharge. Belongings returned and signed for, given to sheriff.

## 2016-03-05 NOTE — Tx Team (Signed)
Initial Interdisciplinary Treatment Plan   PATIENT STRESSORS: Medication change or noncompliance   PATIENT STRENGTHS: Communication skills General fund of knowledge Motivation for treatment/growth Supportive family/friends   PROBLEM LIST: Problem List/Patient Goals Date to be addressed Date deferred Reason deferred Estimated date of resolution  Psychosis 03/05/16     Substance Abuse 03/05/16                                                DISCHARGE CRITERIA:  Improved stabilization in mood, thinking, and/or behavior  PRELIMINARY DISCHARGE PLAN: Outpatient therapy  PATIENT/FAMIILY INVOLVEMENT: This treatment plan has been presented to and reviewed with the patient, Kevin Vance, and/or family member.  The patient and family have been given the opportunity to ask questions and make suggestions.  Nash Mantis [facility] 03/05/2016, 4:51 PM

## 2016-03-06 ENCOUNTER — Encounter: Payer: Self-pay | Admitting: Psychiatry

## 2016-03-06 DIAGNOSIS — F250 Schizoaffective disorder, bipolar type: Principal | ICD-10-CM

## 2016-03-06 DIAGNOSIS — Z9119 Patient's noncompliance with other medical treatment and regimen: Secondary | ICD-10-CM

## 2016-03-06 DIAGNOSIS — Z91199 Patient's noncompliance with other medical treatment and regimen due to unspecified reason: Secondary | ICD-10-CM

## 2016-03-06 MED ORDER — DIVALPROEX SODIUM 500 MG PO DR TAB
500.0000 mg | DELAYED_RELEASE_TABLET | Freq: Three times a day (TID) | ORAL | Status: DC
  Administered 2016-03-08 – 2016-03-10 (×10): 500 mg via ORAL
  Filled 2016-03-06 (×15): qty 1

## 2016-03-06 MED ORDER — OLANZAPINE 7.5 MG PO TABS
15.0000 mg | ORAL_TABLET | Freq: Every day | ORAL | Status: DC
  Administered 2016-03-06 – 2016-03-07 (×2): 15 mg via ORAL
  Filled 2016-03-06 (×3): qty 2
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
## 2016-03-06 NOTE — BHH Counselor (Signed)
CSW attempted to complete PSA with patient. CSW went to patient's room and introduced herself. Patient sat up on his bed and began to mumble inaudible words. Patient then layed back down and stated "I just can't... I'm not sure right now". CSW attempted to re-engage with patient. Patient would not response to prompts from [place]. Patient is too acute to assess at this time.   Jordanny Waddington G. [place], [facility] 03/06/2016 3:53 PM

## 2016-03-06 NOTE — BHH Group Notes (Signed)
[place] Group Notes:  (Nursing/MHT/Case Management/Adjunct)  Date:  03/06/2016  Time:  5:04 AM  Type of Therapy:  Psychoeducational Skills  Participation Level:  Active  Participation Quality:  Appropriate  Affect:  Appropriate  Cognitive:  Appropriate  Insight:  Appropriate and Good  Engagement in Group:  Engaged  Modes of Intervention:  Discussion, Socialization and Support  Summary of Progress/Problems:  Kevin Vance 03/06/2016, 5:04 AM

## 2016-03-06 NOTE — Progress Notes (Signed)
D: Isolates to room most of the day. Did go outside into the courtyard to play basketball with peers this morning, but has remained in bed the rest of the day except at mealtime. Pt appears lethargic. Denies SI/HI. Guarded, forwards little. Endorses auditory and visual hallucinations.  A: No medications ordered on day shift. Support and encouragement offered. Safety checks every 15 minutes.   R: Safety maintained. Isolative this afternoon. Cooperative.

## 2016-03-06 NOTE — Plan of Care (Signed)
Problem: Coping: Goal: Ability to verbalize feelings will improve Outcome: Progressing Quiet, isolative, lethargic today. Went outside this morning, but remained in bed the rest of the day. Support and encouragement provided. Will continue to monitor.

## 2016-03-06 NOTE — BHH Group Notes (Signed)
[place] LCSW Group Therapy  03/06/2016 2:54 PM  Type of Therapy:  Group Therapy  Participation Level:  None, Patient came to group but did not participate and left group after about 5 minutes and did not return.   Summary of Progress/Problems:Coping Skills: Patients defined and discussed healthy coping skills. Patients identified healthy coping skills they would like to try during hospitalization and after discharge. CSW offered insight to varying coping skills that may have been new to patients such as practicing mindfulness.  Kevin Vance G. Hubbard, [place] 03/06/2016, 2:57 PM

## 2016-03-07 MED ORDER — NICOTINE 21 MG/24HR TD PT24
21.0000 mg | MEDICATED_PATCH | Freq: Every day | TRANSDERMAL | Status: DC
Start: 2016-03-07 — End: 2016-03-12
  Filled 2016-03-07: qty 1

## 2016-03-07 NOTE — Progress Notes (Signed)
D: Pt denies SI/HI/AVH. Pt is pleasant and cooperative. Pt c/o generalized body pain, was medicated with tramadol PRN. Patient appears less anxious and he is interacting with peers and staff appropriately.  A: Pt was offered support and encouragement. Pt was given scheduled medications. Pt was encouraged to attend groups. Q 15 minute checks were done for safety.  R:Pt attends groups and interacts well with peers and staff. Pt is taking medication. Pt has no complaints.Pt receptive to treatment and safety maintained on unit.

## 2016-03-07 NOTE — Progress Notes (Signed)
Recreation Therapy Notes  Date: 08.21.17 Time: 9:30 am Location: Craft Room  Group Topic: Self-expression  Goal Area(s) Addresses:  Patient will identify one color per emotion listed on wheel. Patient will verbalize benefit of using art as a means of self-expression. Patient will verbalize one emotion experienced during session. Patient will be educated on other forms of self-expression.  Behavioral Response: Did not attend  Intervention: Emotion Wheel  Activity: Patients were given an Emotion Wheel worksheet and instructed to pick a color for each emotion listed on the wheel.  Education: LRT educated patients on other forms of self-expression.  Education Outcome: Patient did not attend group.  Clinical Observations/Feedback: Patient did not attend group.  Leonette Monarch, LRT/CTRS 03/07/2016 10:21 AM

## 2016-03-07 NOTE — Progress Notes (Signed)
Patient irritable, angry and demanding. At around 12pm, patient was seen in the hallways being loud, cursing, threatening and demanding to get a grievance form. When asked what he could be assisted with, he just became even louder, cursed and paced. Apparently, per tech staff, he got the "wrong" meal tray and became upset. Staff said that he did not fill out his menu. Another tray was called in. Then again, at about 2pm, patient was called to the medication room for his medications. He became loud and belligerent again, stating that he would not take any medication. MD notified of the refusal. Will continue to monitor for agitation and intervene early.

## 2016-03-07 NOTE — Progress Notes (Signed)
[facility] MD Progress Note  03/07/2016 12:54 PM [name]  MRN:  [id number]  Subjective:  Kevin Vance is in bed this morning falling asleep while we are talking. He is pretty disorganized and unable to provide much information. He knows the name of his psychiatrist but does not know the name of any of his medications. Seroquel and Depakote were on his list. He was started on Depakote and Zyprexa but refused Depakote saying that it makes him too slow. He has no somatic complaints. There is no group participation.  Principal Problem: Schizoaffective disorder, bipolar type ([place]) Diagnosis:   Patient Active Problem List   Diagnosis Date Noted  . Noncompliance [Z91.19] 03/06/2016  . Schizoaffective disorder, bipolar type ([place]) [F25.0] 03/05/2016  . Tobacco use disorder [F17.200] 02/08/2010  . 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 KNEE PAIN, LEFT [M25.569] 11/11/2008  . ELECTROCARDIOGRAM, ABNORMAL [R94.31] 11/11/2008  . CLOSED DISLOCATION OF ACROMIOCLAVICULAR [S43.109A] 04/17/2008  . Bipolar 1 disorder ([place]) [F31.9] 04/03/2008  . ADD [F98.8] 04/03/2008  . ALLERGIC RHINITIS [J30.9] 04/03/2008  . HEMANGIOMA OF SKIN AND SUBCUTANEOUS TISSUE [D18.01] 04/02/2008   Total Time spent with patient: 20 minutes  Past Psychiatric History: Bipolar disorder.  Past Medical History:  Past Medical History:  Diagnosis Date  . Bipolar 1 disorder ([place])   . Schizoaffective disorder [facility])     Past Surgical History:  Procedure Laterality Date  . ROTATOR CUFF REPAIR     Family History: History reviewed. No pertinent family history. Family Psychiatric  History: See H&P.  Social History:  History  Alcohol Use No     History  Drug Use No    Comment: stopped    Social History   Social History  . Marital status: Single    Spouse name: N/A  . Number of children: N/A  . Years of education: N/A   Social History Main Topics  . Smoking status: Former Smoker    Quit date: 01/16/2016  .  Smokeless tobacco: Never Used  . Alcohol use No  . Drug use: No     Comment: stopped  . Sexual activity: Not Asked   Other Topics Concern  . None   Social History Narrative  . None   Additional Social History:                         Sleep: Fair  Appetite:  Fair  Current Medications: Current Facility-Administered Medications  Medication Dose Route Frequency Provider Last Rate Last Dose  . acetaminophen (TYLENOL) tablet 650 mg  650 mg Oral Q6H PRN Hildred Priest, MD      . alum & mag hydroxide-simeth (MAALOX/MYLANTA) 200-200-20 MG/5ML suspension 30 mL  30 mL Oral Q4H PRN Hildred Priest, MD      . divalproex (DEPAKOTE) DR tablet 500 mg  500 mg Oral Q8H Xenia Nile B Lanee Chain, MD      . magnesium hydroxide (MILK OF MAGNESIA) suspension 30 mL  30 mL Oral Daily PRN Hildred Priest, MD      . OLANZapine (ZYPREXA) tablet 15 mg  15 mg Oral QHS Clovis Fredrickson, MD   15 mg at 03/06/16 2137  . traZODone (DESYREL) tablet 50 mg  50 mg Oral QHS PRN Hildred Priest, MD   50 mg at 03/06/16 2136    Lab Results: No results found for this or any previous visit (from the past 48 hour(s)).  Blood Alcohol level:  Lab Results  Component Value Date   ETH <5  03/03/2016   ETH <5 [id number]    Metabolic Disorder Labs: No results found for: HGBA1C, MPG No results found for: PROLACTIN No results found for: CHOL, TRIG, HDL, CHOLHDL, VLDL, LDLCALC  Physical Findings: AIMS: Facial and Oral Movements Muscles of Facial Expression: None, normal Lips and Perioral Area: None, normal Jaw: None, normal Tongue: None, normal,Extremity Movements Upper (arms, wrists, hands, fingers): None, normal Lower (legs, knees, ankles, toes): None, normal, Trunk Movements Neck, shoulders, hips: None, normal, Overall Severity Severity of abnormal movements (highest score from questions above): None, normal Incapacitation due to abnormal movements: None,  normal Patient's awareness of abnormal movements (rate only patient's report): No Awareness, Dental Status Current problems with teeth and/or dentures?: No Does patient usually wear dentures?: No  CIWA:    COWS:     Musculoskeletal: Strength & Muscle Tone: within normal limits Gait & Station: normal Patient leans: N/A  Psychiatric Specialty Exam: Physical Exam  Nursing note and vitals reviewed.   Review of Systems  Psychiatric/Behavioral: Positive for hallucinations.  All other systems reviewed and are negative.   Blood pressure 116/69, pulse (!) 45, temperature 98.6 F (37 C), temperature source Oral, resp. rate 18, height 5\' 9"  (1.753 m), weight 82.6 kg (182 lb), SpO2 99 %.Body mass index is 26.88 kg/m.  General Appearance: Disheveled  Eye Contact:  Minimal  Speech:  Slow and Slurred  Volume:  Normal  Mood:  Angry, Dysphoric and Irritable  Affect:  Blunt  Thought Process:  Disorganized  Orientation:  Full (Time, Place, and Person)  Thought Content:  Delusions, Hallucinations: Auditory and Paranoid Ideation  Suicidal Thoughts:  No  Homicidal Thoughts:  No  Memory:  Immediate;   Fair Recent;   Fair Remote;   Fair  Judgement:  Poor  Insight:  Lacking  Psychomotor Activity:  Psychomotor Retardation  Concentration:  Concentration: Fair and Attention Span: Fair  Recall:  [employer] of Knowledge:  Fair  Language:  Fair  Akathisia:  No  Handed:  Right  AIMS (if indicated):     Assets:  Communication Skills Desire for Improvement Housing Physical Health Resilience Social Support  ADL's:  Intact  Cognition:  WNL  Sleep:  Number of Hours: 5.75     Treatment Plan Summary: Daily contact with patient to assess and evaluate symptoms and progress in treatment and Medication management   Mr. Kevin Vance is a 25 year old male with a history of bipolar illness admitted in a manic, psychotic episode in the context of treatment noncompliance.  1. Mood and psychosis. He was  started on Zyprexa and Depakote. He refuses Depakote.  2. Insomnia. Trazodone is available.  3. Metabolic syndrome monitoring. Lipid profile, TSH, hemoglobin A1c and prolactin are pending.  4. Smoking. Nicotine patch is available.  5. Disposition. He will be discharged to home with his mother. Follow-up with his psychiatrist in [place].  Orson Slick, MD 03/07/2016, 12:54 PM

## 2016-03-07 NOTE — BHH Group Notes (Signed)
[place] LCSW Group Therapy   03/07/2016 1pm Type of Therapy: Group Therapy   Participation Level: Active   Participation Quality: Attentive, Sharing and Supportive   Affect: Depressed and Flat   Cognitive: Alert and Oriented   Insight: Developing/Improving and Engaged   Engagement in Therapy: Developing/Improving and Engaged   Modes of Intervention: Clarification, Confrontation, Discussion, Education, Exploration,  Limit-setting, Orientation, Problem-solving, Rapport Building, Art therapist, Socialization and Support   Summary of Progress/Problems: Pt identified obstacles faced currently and processed barriers involved in overcoming these obstacles. Pt identified steps necessary for overcoming these obstacles and explored motivation (internal and external) for facing these difficulties head on. Pt further identified one area of concern in their lives and chose a goal to focus on for today. Pt shared he was not happy with his medications as they were managed by the doctors, and that this was his primary obstacle.  Pt shared that in the past he has used substances to cope and that this is his primary coping mechanism. Pt presented as irritable and agitated but was otherwise polite and cooperative with the CSW and other group members and focused and attentive to the topics discussed and the sharing of others.   Alphonse Guild. Pietro Bonura, LCSWA, LCAS

## 2016-03-07 NOTE — BHH Group Notes (Signed)
[place] Group Notes:  (Nursing/MHT/Case Management/Adjunct)  Date:  03/07/2016  Time:  3:26 AM  Type of Therapy:  Group Therapy  Participation Level:  Minimal  Participation Quality:  Resistant  Affect:  Flat and Irritable  Cognitive:  Oriented  Insight:  None  Engagement in Group:  Limited  Modes of Intervention:  Discussion  Summary of Progress/Problems: Staff attempted to engage pt into conversation about his goal. Pt irritably replied "no" and sat facing away from the group and continued to sigh and mumble under his breath.   Jenetta Downer Nicolas Sisler 03/07/2016, 3:26 AM

## 2016-03-07 NOTE — Plan of Care (Signed)
Problem: Pain Managment: Goal: General experience of comfort will improve Outcome: Progressing Patient denies pain at this time   

## 2016-03-07 NOTE — BHH Group Notes (Signed)
[place] [place] Group Notes:  (Nursing/MHT/Case Management/Adjunct)  Date:  03/07/2016  Time:  11:14 PM  Type of Therapy:  Evening Wrap-up Group  Participation Level:  Active  Participation Quality:  Appropriate and Attentive  Affect:  Appropriate  Cognitive:  Alert  Insight:  Appropriate  Engagement in Group:  Developing/Improving and Engaged  Modes of Intervention:  Activity and Discussion  Summary of Progress/Problems:  Levonne Spiller 03/07/2016, 11:14 PM

## 2016-03-07 NOTE — Progress Notes (Signed)
D: Patient has been irritable and belligerent towards staff. At one point he called a staff member racists. He denied SI/HI/AVH. When asking about pain he stated if I did you probably wouldn't give me the "good stuff."  A: Medication given with education. Encouragement provided.  R: Patient refused depakote but took zyprexa. He then stated, "that's just how I do." Safety maintained with 15 min checks.

## 2016-03-08 LAB — LIPID PANEL
CHOLESTEROL: 175 mg/dL (ref 0–200)
HDL: 36 mg/dL — ABNORMAL LOW (ref >40)
LDL Cholesterol: 93 mg/dL (ref 0–99)
Total CHOL/HDL Ratio: 4.9 RATIO
Triglycerides: 230 mg/dL — ABNORMAL HIGH (ref <150)
VLDL: 46 mg/dL — ABNORMAL HIGH (ref 0–40)

## 2016-03-08 LAB — HEMOGLOBIN A1C: HEMOGLOBIN A1C: 5.5 % (ref 4.0–6.0)

## 2016-03-08 LAB — TSH: TSH: 4.089 u[IU]/mL (ref 0.350–4.500)

## 2016-03-08 MED ORDER — LORAZEPAM 2 MG PO TABS
2.0000 mg | ORAL_TABLET | ORAL | Status: DC | PRN
  Filled 2016-03-08: qty 1

## 2016-03-08 MED ORDER — TRAZODONE HCL 100 MG PO TABS: 100.0000 mg | ORAL_TABLET | Freq: Every evening | ORAL | Status: DC | PRN

## 2016-03-08 MED ORDER — OLANZAPINE 10 MG PO TABS
20.0000 mg | ORAL_TABLET | Freq: Every day | ORAL | Status: DC
  Administered 2016-03-08 – 2016-03-11 (×4): 20 mg via ORAL
  Filled 2016-03-08 (×4): qty 2

## 2016-03-08 MED ORDER — HALOPERIDOL LACTATE 5 MG/ML IJ SOLN: 5.0000 mg | Freq: Three times a day (TID) | INTRAMUSCULAR | Status: DC | PRN

## 2016-03-08 MED ORDER — TRAZODONE HCL 100 MG PO TABS
100.0000 mg | ORAL_TABLET | Freq: Every day | ORAL | Status: DC
  Administered 2016-03-08 – 2016-03-10 (×3): 100 mg via ORAL
  Filled 2016-03-08 (×3): qty 1

## 2016-03-08 MED ORDER — DIPHENHYDRAMINE HCL 25 MG PO CAPS
50.0000 mg | ORAL_CAPSULE | Freq: Three times a day (TID) | ORAL | Status: DC | PRN
  Filled 2016-03-08: qty 2

## 2016-03-08 MED ORDER — DIPHENHYDRAMINE HCL 50 MG/ML IJ SOLN: 50.0000 mg | Freq: Three times a day (TID) | INTRAMUSCULAR | Status: DC | PRN

## 2016-03-08 MED ORDER — HALOPERIDOL 5 MG PO TABS
5.0000 mg | ORAL_TABLET | Freq: Three times a day (TID) | ORAL | Status: DC | PRN
  Filled 2016-03-08: qty 1

## 2016-03-08 MED ORDER — LORAZEPAM 2 MG/ML IJ SOLN: 2.0000 mg | INTRAMUSCULAR | Status: DC | PRN

## 2016-03-08 NOTE — Progress Notes (Signed)
[facility] MD Progress Note  03/08/2016 12:57 PM Kevin Vance  MRN:  [id number]  Subjective:  Patient was seen today during assessment he was calm and cooperative. He reported having paranoia and visual hallucinations prior to admission. He is stated that he was noncompliant with medications. He appeared suspicious, tense and guarded during the assessment.  Yesterday he refused Depakote but today he took it. Per Education officer, museum he was very argumentative, impatience and uncooperative during group group.  Per nursing: D: Patient has been irritable and belligerent towards staff. At one point he called a staff member racists. He denied SI/HI/AVH. When asking about pain he stated if I did you probably wouldn't give me the "good stuff."  A: Medication given with education. Encouragement provided.  R: Patient refused depakote but took zyprexa. He then stated, "that's just how I do." Safety maintained with 15 min checks.   Principal Problem: Schizoaffective disorder, bipolar type ([place]) Diagnosis:   Patient Active Problem List   Diagnosis Date Noted  . Noncompliance [Z91.19] 03/06/2016  . Schizoaffective disorder, bipolar type ([place]) [F25.0] 03/05/2016  . Tobacco use disorder [F17.200] 02/08/2010  . HEMANGIOMA OF SKIN AND SUBCUTANEOUS TISSUE [D18.01] 04/02/2008   Total Time spent with patient: 30 minutes  Past Psychiatric History: Bipolar disorder.   Past Medical History:  Past Medical History:  Diagnosis Date  . Bipolar 1 disorder ([place])   . Schizoaffective disorder [facility])     Past Surgical History:  Procedure Laterality Date  . ROTATOR CUFF REPAIR     Family History: History reviewed. No pertinent family history.  Family Psychiatric  History: See H&P.   Social History:  History  Alcohol Use No     History  Drug Use No    Comment: stopped    Social History   Social History  . Marital status: Single    Spouse name: N/A  . Number of children: N/A  . Years of education: N/A   Social  History Main Topics  . Smoking status: Former Smoker    Quit date: 01/16/2016  . Smokeless tobacco: Never Used  . Alcohol use No  . Drug use: No     Comment: stopped  . Sexual activity: Not Asked   Other Topics Concern  . None   Social History Narrative  . None     Current Medications: Current Facility-Administered Medications  Medication Dose Route Frequency Provider Last Rate Last Dose  . acetaminophen (TYLENOL) tablet 650 mg  650 mg Oral Q6H PRN Hildred Priest, MD      . alum & mag hydroxide-simeth (MAALOX/MYLANTA) 200-200-20 MG/5ML suspension 30 mL  30 mL Oral Q4H PRN Hildred Priest, MD      . divalproex (DEPAKOTE) DR tablet 500 mg  500 mg Oral Q8H Jolanta B Pucilowska, MD   500 mg at 03/08/16 0643  . magnesium hydroxide (MILK OF MAGNESIA) suspension 30 mL  30 mL Oral Daily PRN Hildred Priest, MD      . nicotine (NICODERM CQ - dosed in mg/24 hours) patch 21 mg  21 mg Transdermal Daily Jolanta B Pucilowska, MD      . OLANZapine (ZYPREXA) tablet 15 mg  15 mg Oral QHS Clovis Fredrickson, MD   15 mg at 03/07/16 2141  . traZODone (DESYREL) tablet 100 mg  100 mg Oral QHS Hildred Priest, MD        Lab Results:  Results for orders placed or performed during the hospital encounter of 03/05/16 (from the past 48 hour(s))  Lipid panel  Status: Abnormal   Collection Time: 03/08/16  7:00 AM  Result Value Ref Range   Cholesterol 175 0 - 200 mg/dL   Triglycerides 230 (H) <150 mg/dL   HDL 36 (L) >40 mg/dL   Total CHOL/HDL Ratio 4.9 RATIO   VLDL 46 (H) 0 - 40 mg/dL   LDL Cholesterol 93 0 - 99 mg/dL    Comment:        Total Cholesterol/HDL:CHD Risk Coronary Heart Disease Risk Table                     Men   Women  1/2 Average Risk   3.4   3.3  Average Risk       5.0   4.4  2 X Average Risk   9.6   7.1  3 X Average Risk  23.4   11.0        Use the calculated Patient Ratio above and the CHD Risk Table to determine the patient's CHD  Risk.        ATP III CLASSIFICATION (LDL):  <100     mg/dL   Optimal  100-129  mg/dL   Near or Above                    Optimal  130-159  mg/dL   Borderline  160-189  mg/dL   High  >190     mg/dL   Very High   TSH     Status: None   Collection Time: 03/08/16  7:00 AM  Result Value Ref Range   TSH 4.089 0.350 - 4.500 uIU/mL    Blood Alcohol level:  Lab Results  Component Value Date   ETH <5 03/03/2016   ETH <5 [id number]    Metabolic Disorder Labs: No results found for: HGBA1C, MPG No results found for: PROLACTIN Lab Results  Component Value Date   CHOL 175 03/08/2016   TRIG 230 (H) 03/08/2016   HDL 36 (L) 03/08/2016   CHOLHDL 4.9 03/08/2016   VLDL 46 (H) 03/08/2016   LDLCALC 93 03/08/2016    Physical Findings: AIMS: Facial and Oral Movements Muscles of Facial Expression: None, normal Lips and Perioral Area: None, normal Jaw: None, normal Tongue: None, normal,Extremity Movements Upper (arms, wrists, hands, fingers): None, normal Lower (legs, knees, ankles, toes): None, normal, Trunk Movements Neck, shoulders, hips: None, normal, Overall Severity Severity of abnormal movements (highest score from questions above): None, normal Incapacitation due to abnormal movements: None, normal Patient's awareness of abnormal movements (rate only patient's report): No Awareness, Dental Status Current problems with teeth and/or dentures?: No Does patient usually wear dentures?: No  CIWA:    COWS:     Musculoskeletal: Strength & Muscle Tone: within normal limits Gait & Station: normal Patient leans: N/A  Psychiatric Specialty Exam: Physical Exam  Nursing note and vitals reviewed. Constitutional: He is oriented to person, place, and time. He appears well-developed and well-nourished.  HENT:  Head: Normocephalic and atraumatic.  Eyes: EOM are normal.  Neck: Normal range of motion.  Respiratory: Effort normal.  Musculoskeletal: Normal range of motion.  Neurological: He  is alert and oriented to person, place, and time.    Review of Systems  Constitutional: Negative.   HENT: Negative.   Respiratory: Negative.   Cardiovascular: Negative.   Gastrointestinal: Negative.   Genitourinary: Negative.   Musculoskeletal: Negative.   Skin: Negative.   Neurological: Negative.   Endo/Heme/Allergies: Negative.   Psychiatric/Behavioral: Positive for hallucinations.  All other  systems reviewed and are negative.   Blood pressure 112/70, pulse (!) 57, temperature 98.1 F (36.7 C), temperature source Oral, resp. rate 18, height 5\' 9"  (1.753 m), weight 82.6 kg (182 lb), SpO2 99 %.Body mass index is 26.88 kg/m.  General Appearance: Disheveled  Eye Contact:  Fair  Speech:  Normal Rate  Volume:  Normal  Mood:  Dysphoric  Affect:  Congruent  Thought Process:  Linear and Descriptions of Associations: Intact  Orientation:  Full (Time, Place, and Person)  Thought Content:  Delusions and Hallucinations: Visual  Suicidal Thoughts:  No  Homicidal Thoughts:  No  Memory:  Immediate;   Fair Recent;   Fair Remote;   Fair  Judgement:  Poor  Insight:  Shallow  Psychomotor Activity:  Psychomotor Retardation  Concentration:  Concentration: Fair and Attention Span: Fair  Recall:  [employer] of Knowledge:  Fair  Language:  Fair  Akathisia:  No  Handed:  Right  AIMS (if indicated):     Assets:  Communication Skills Desire for Improvement Housing Physical Health Resilience Social Support  ADL's:  Intact  Cognition:  WNL  Sleep:  Number of Hours: 5.75     Treatment Plan Summary: Daily contact with patient to assess and evaluate symptoms and progress in treatment and Medication management   Mr. Mockler is a 25 year old male with a history of bipolar illness admitted in a manic, psychotic episode in the context of treatment noncompliance.  1. Mood and psychosis. He was started on Zyprexa and Depakote.  -Continue olanzapine 15 mg po qhs -Continue depakote 500 mg tid  (refused depakote yesterday but took it today)  2. Insomnia: trazodone will be increased to 100 mg po qhs as pt didn't sleep well.  3. Metabolic syndrome monitoring. Lipid profile, TSH, hemoglobin A1c and prolactin are pending.  4. Smoking. Nicotine patch is available.  5. Disposition. He will be discharged to home with his mother. Follow-up with his psychiatrist in [place].  Past h/o of abnormal EKG will order EKG today  Hildred Priest, MD 03/08/2016, 12:57 PM

## 2016-03-08 NOTE — BHH Group Notes (Signed)
Goals Group Date/Time: 03/08/2016 9:00 AM Type of Therapy and Topic: Group Therapy: Goals Group: SMART Goals   Participation Level: Moderate  Description of Group:    The purpose of a daily goals group is to assist and guide patients in setting recovery/wellness-related goals. The objective is to set goals as they relate to the crisis in which they were admitted. Patients will be using SMART goal modalities to set measurable goals. Characteristics of realistic goals will be discussed and patients will be assisted in setting and processing how one will reach their goal. Facilitator will also assist patients in applying interventions and coping skills learned in psycho-education groups to the SMART goal and process how one will achieve defined goal.   Therapeutic Goals:   -Patients will develop and document one goal related to or their crisis in which brought them into treatment.  -Patients will be guided by LCSW using SMART goal setting modality in how to set a measurable, attainable, realistic and time sensitive goal.  -Patients will process barriers in reaching goal.  -Patients will process interventions in how to overcome and successful in reaching goal.   Patient's Goal: Patient stated that his goal for today is to meet with his psychiatrist and discuss medication adjustments. He stated that medications has been a problem and one of his biggest concerns.   Therapeutic Modalities:  Motivational Interviewing  Public relations account executive Therapy  Crisis Intervention Model  SMART goals setting   Emilie Rutter, LCSWA

## 2016-03-08 NOTE — Progress Notes (Signed)
D: Continues to be irritable with staff. Sullen appearance. Denies SI/HI/AVH. Complained regarding dosage of medication during medication administration and said, "I hope this medication does not kill me. If it does, you all will be in trouble."  A: Medication administered as ordered. Support and encouragement provided. Every 15 minute safety checks.   R: Medication complaint with much explanation by nurse, complaints by pt. Safety maintained. Attended group, out of bed more today.

## 2016-03-08 NOTE — Progress Notes (Signed)
Recreation Therapy Notes  Date: 08.22.17 Time: 9:30 am Location: Craft Room  Group Topic: Goal Setting  Goal Area(s) Addresses:  Patient will write one goal. Patient will write at least one supportive statement.  Behavioral Response: Attentive  Intervention: Step By Step  Activity: Patients were given a foot worksheet and instructed to write a goal inside the foot. On the outside of the foot, patients were instructed to write supportive statements to help them reach their goals.  Education: LRT educated patients on healthy ways to celebrate reaching their goals.  Education Outcome: In group clarification offered  Clinical Observations/Feedback: Patient completed activity by writing a goal and supportive statements. Patient did not contribute to group discussion.  Leonette Monarch, LRT/CTRS 03/08/2016 10:27 AM

## 2016-03-08 NOTE — Progress Notes (Signed)
Having difficulty sleeping, requested for medication to help him; Depakote 500 mg re-offered, and given with 50 mg of Trazodone.

## 2016-03-08 NOTE — BHH Group Notes (Signed)
[place] LCSW Group Therapy   03/08/2016 9:30AM  Type of Therapy: Group Therapy   Participation Level: Pt invited but did not attend. Participation Quality: Pt invited but did not attend.     Emilie Rutter, LCSWA

## 2016-03-08 NOTE — BHH Suicide Risk Assessment (Signed)
[place] INPATIENT:  Family/Significant Other Suicide Prevention Education  Suicide Prevention Education:  Education Completed; mother, Evens Soderberg ph#: [phone] has been identified by the patient as the family member/significant other with whom the patient will be residing, and identified as the person(s) who will aid the patient in the event of a mental health crisis (suicidal ideations/suicide attempt).  With written consent from the patient, the family member/significant other has been provided the following suicide prevention education, prior to the and/or following the discharge of the patient. Patient's mother expressed concerns about pt's behaviors. She stated that patient has explosive behaviors. She stated that pt is noncompliant with medications and is afraid that symptoms will not get better until he is compliant with therapy and medications.  The suicide prevention education provided includes the following:  Suicide risk factors  Suicide prevention and interventions  National Suicide Hotline telephone number  [facility] assessment telephone number  [facility] Emergency Assistance [place] and/or Residential Mobile Crisis Unit telephone number  Request made of family/significant other to:  Remove weapons (e.g., guns, rifles, knives), all items previously/currently identified as safety concern.    Remove drugs/medications (over-the-counter, prescriptions, illicit drugs), all items previously/currently identified as a safety concern.  The family member/significant other verbalizes understanding of the suicide prevention education information provided.  The family member/significant other agrees to remove the items of safety concern listed above.  Emilie Rutter, MSW, LCSW-A 03/08/2016, 11:43 AM

## 2016-03-08 NOTE — BHH Counselor (Signed)
Adult Comprehensive Assessment  [name], male   DOB: September 06, 1990, 25 y.o.   MRN: [id number]  Information Source: Information source: Patient  Current Stressors:  Educational / Learning stressors: No stressors identified  Employment / Job issues: No stressors identified  Family Relationships: No stressors identified  Museum/gallery curator / Lack of resources (include bankruptcy): No stressors identified  Housing / Lack of housing: No stressors identified  Physical health (include injuries & life threatening diseases): No stressors identified  Social relationships: No stressors identified  Substance abuse: Marijuna use  Bereavement / Loss: No stressors identified   Living/Environment/Situation:  Living Arrangements: Parent Living conditions (as described by patient or guardian): They are wonderful How long has patient lived in current situation?: Pt has lived with mother for a while and cannot live alone  What is atmosphere in current home: Comfortable, Quarry manager, Supportive  Family History:  Marital status: Single Are you sexually active?:  (Unknown) What is your sexual orientation?: Unknown Has your sexual activity been affected by drugs, alcohol, medication, or emotional stress?: N/A Does patient have children?: No  Childhood History:  By whom was/is the patient raised?: Mother Description of patient's relationship with caregiver when they were a child: They were good - no concerns Patient's description of current relationship with people who raised him/her: They are good - mother is supportive in care How were you disciplined when you got in trouble as a child/adolescent?: Punishment Does patient have siblings?: Yes Number of Siblings: 1 Description of patient's current relationship with siblings: No relationship with brother - does not live in [place] Did patient suffer any verbal/emotional/physical/sexual abuse as a child?: No Did patient suffer from severe childhood neglect?:  No Has patient ever been sexually abused/assaulted/raped as an adolescent or adult?: No Was the patient ever a victim of a crime or a disaster?: No Witnessed domestic violence?: No Has patient been effected by domestic violence as an adult?: No  Education:  Highest grade of school patient has completed: GED and some community college Currently a student?: No Learning disability?: No  Employment/Work Situation:   Employment situation: On disability Why is patient on disability: Bipolar disorder  How long has patient been on disability: Unknown Patient's job has been impacted by current illness: No What is the longest time patient has a held a job?: Pt does not know Where was the patient employed at that time?: Pt stated that he has only worked "under the table" Has patient ever been in the [employer]?: No Has patient ever served in combat?: No Did You Receive Any Psychiatric Treatment/Services While in Passenger transport manager?: No Are There Guns or Other Weapons in [place]?: No Are These Psychologist, educational?:  (N/A)  Financial Resources:   Financial resources: [employer], Support from parents / caregiver  Alcohol/Substance Abuse:   What has been your use of drugs/alcohol within the last 12 months?: Marijuana If attempted suicide, did drugs/alcohol play a role in this?: No Alcohol/Substance Abuse Treatment Hx: Denies past history Has alcohol/substance abuse ever caused legal problems?: No  Social Support System:   Patient's Community Support System: Good Describe Community Support System: Pt stated that mother is all that he needs in order to combat mental illness Type of faith/religion: unknown How does patient's faith help to cope with current illness?: unknown  Leisure/Recreation:   Leisure and Hobbies: Pt cannot identify hobbies  Strengths/Needs:   What things does the patient do well?: Pt cannot identify In what areas does patient struggle / problems for  patient: Pt has  problems with being compliant with medications   Discharge Plan:   Does patient have access to transportation?: Yes Will patient be returning to same living situation after discharge?: Yes Currently receiving community mental health services: Yes (From Whom) ([place] ) Does patient have financial barriers related to discharge medications?: No  Summary/Recommendations:   Summary and Recommendations (to be completed by the evaluator): Patient presented to the hospital involuntarily. Patient is a 25 year old man with history of bipolar. Pt was admitted for anger outburst, paranoia, and hallucinations. Pt stated that he was on medications but stopped taking them because he did not like the side effects. Pt is agreeable to try other medication options. Pt denies SI/HI at this. Patient lives in [place], [place] with mother. Patient will benefit from crisis stabilization, medication evaluation, group therapy, and psycho education in addition to case management for discharge planning. Patient and CSW reviewed pt's identified goals and treatment plan. Pt verbalized understanding and agreed to treatment plan.  At discharge it is recommended that patient remain compliant with established plan and continue treatment.  Emilie Rutter, MSW, LCSW-A  03/08/2016

## 2016-03-08 NOTE — Plan of Care (Signed)
Problem: Safety: Goal: Ability to redirect hostility and anger into socially appropriate behaviors will improve Outcome: Not Progressing Continues to be irritable with interactions. Complains at medication time. Attended group activity. Will continue to monitor.

## 2016-03-08 NOTE — Progress Notes (Signed)
Agitated while in the day room.  No clear trigger  Nurses requested prn  Will order haldol 5 mg, Ativan 2 mg and benadryl 50 mg po or IM

## 2016-03-08 NOTE — Progress Notes (Signed)
Mental Health techs approached nurse regarding pt being loud, belligerent in the day room while eating dinner. Tech reports pt became upset after being woke to eat dinner. Staff attempted to wake pt for dinner several times. Later on, pt came to dayroom upset first because he was woke, then became upset because he was late to dinner and his food was reportedly cold.   On assessment, pt was in the dayroom alone with 2 techs eating his meal tray yelling loudly, cursing. Nurse contacted Dr. Jerilee Hoh for PRN orders (see note).  When finished eating, pt came out of the dayroom continuing to yell loudly, cursing. Nurse approached pt with PRN medications, he refused and continued to walk to his room and yell loudly and belligerently.   Shortly after, pt came from room, yelling into the nurse's station that he "wanted a form to write down his compliant's because staff is lazy and didn't wake me for dinner and my dinner was cold." Pt survey provided to pt, and he returned to his room.   Pt completed the survey and returned it to nurse, but continued to be loud, belligerent and curse "fuck this place and the lazy staff members" then walked away quietly.   Will continue to monitor.

## 2016-03-09 LAB — PROLACTIN: Prolactin: 28.4 ng/mL — ABNORMAL HIGH (ref 4.0–15.2)

## 2016-03-09 MED ORDER — ACETAMINOPHEN 500 MG PO TABS: 1000.0000 mg | ORAL_TABLET | Freq: Four times a day (QID) | ORAL | Status: DC | PRN

## 2016-03-09 MED ORDER — IBUPROFEN 600 MG PO TABS
600.0000 mg | ORAL_TABLET | Freq: Four times a day (QID) | ORAL | Status: DC | PRN
Start: 2016-03-09 — End: 2016-03-12

## 2016-03-09 NOTE — BHH Group Notes (Signed)
[place] LCSW Group Therapy  03/09/2016 11:22 AM  Type of Therapy:  Group Therapy  Participation Level:  None  Participation Quality:  Resistant  Affect:  Anxious  Cognitive:  Lacking  Insight:  None  Engagement in Therapy:  None  Modes of Intervention:  Activity, Discussion, Education and Support  Summary of Progress/Problems:Emotional Regulation: Patients will identify both negative and positive emotions. They will discuss emotions they have difficulty regulating and how they impact their lives. Patients will be asked to identify healthy coping skills to combat unhealthy reactions to negative emotions. Pt did not participate in group and had difficulty sitting still in his chair. CSW asked pt if he was okay in which he responded "I just ready to get out of here". CSW provided support to pt. Pt left group after the first 5 minutes and did not return to group.    Saydie Gerdts G. [place], [place] 03/09/2016, 11:25 AM

## 2016-03-09 NOTE — Plan of Care (Signed)
Problem: Role Relationship: Goal: Ability to communicate needs accurately will improve Outcome: Not Progressing Pt very hostile, irritable when approaching staff. Voices being bothered by unit schedule. No episodes of being loud/yelling thus far this morning. Will continue to monitor and provide support and encouragement for communicating appropriately with staff.

## 2016-03-09 NOTE — Progress Notes (Signed)
Continues to be hostile, irritable this morning. No episodes of being loud/yelling noted this morning. Upon assessment, before breakfast he voices being bothered by the fact that breakfast was not served yet, but remains calm, cooperative. Did not attended group. Will continue to monitor.

## 2016-03-09 NOTE — Progress Notes (Signed)
Admission Note:   Patient arrived to unit via transport at approximately 1345 pm.  Patient dressed in scrubs with strong body odor.  Speech was soft but clear and able to be understood.  Patient affect was flat and depressed and patient stating, "I am very depressed and I do not know how to fix myself".  Patient denies substance abuse at this time. Patient denies suicidal ideations at this time but endorses visual hallucinations of animals and people on the floor.  Patient states," They do not talk to me but I can see them and I have not been to sleep in four days because of this".  Patient oriented to unit, staff, and schedule.    Patient skin assessment completed and patient has two tattoos on right arm and a small bump under his right elbow that patient states, "I think a bug bit me".   Patient denies food and medicine allergies.  Patient has black police monitoring bracelet on left ankle with no signs of friction or skin breakdown noted.  Corporal Lilia Pro from the [facility] office is the contact person for this device.  He notified the facility about the device and his callback numbers are as follows: [phone] and [phone] number if bracelet needs to be removed).  Patient belongings searched and logged by staff.

## 2016-03-09 NOTE — Tx Team (Signed)
Interdisciplinary Treatment Plan Update (Adult)         Date: 03/09/2016   Time Reviewed: 10:30 AM   Progress in Treatment: Improving Attending groups: Yes  Participating in groups: Yes  Taking medication as prescribed: Yes  Tolerating medication: Yes  Family/Significant other contact made: Yes, CSW has spoken with mother. Patient understands diagnosis: Yes  Discussing patient identified problems/goals with staff: Yes  Medical problems stabilized or resolved: Yes  Denies suicidal/homicidal ideation: Yes  Issues/concerns per patient self-inventory: Yes  Other:   New problem(s) identified: N/A   Discharge Plan or Barriers: see below   Reason for Continuation of Hospitalization:   Depression   Anxiety   Medication Stabilization   Comments: N/A   Estimated length of stay: 3-5 days    Patient is a 25 year old male admitted for bipolar disorder and psychosis. Patient lives in [place], [place]. Patient will benefit from crisis stabilization, medication evaluation, group therapy, and psycho education in addition to case management for discharge planning. Patient and CSW reviewed pt's identified goals and treatment plan. Pt verbalized understanding and agreed to treatment plan.    Review of initial/current patient goals per problem list:  1. Goal(s): Patient will participate in aftercare plan   Met: Yes  Target date: 3-5 days post admission date   As evidenced by: Patient will participate within aftercare plan AEB aftercare provider and housing plan at discharge being identified.   Patient will follow-up with [facility].  2. Goal (s): Patient will exhibit decreased depressive symptoms and suicidal ideations.   Met: Goal progressing   Target date: 3-5 days post admission date   As evidenced by: Patient will utilize self-rating of depression at 3 or below and demonstrate decreased signs of depression or be deemed stable for discharge by MD.   Pt  reports a depression score of 4 at this time. Denies SI/HI at this time.  3. Goal(s): Patient will demonstrate decreased signs and symptoms of anxiety.   Met: Goal progressing  Target date: 3-5 days post admission date   As evidenced by: Patient will utilize self-rating of anxiety at 3 or below and demonstrated decreased signs of anxiety, or be deemed stable for discharge by MD   Pt reports an anxiety score of 5 at this time.    4. Goal(s): Patient will demonstrate decreased signs of psychosis  * Met: No * Target date: 3-5 days post admission date  * As evidenced by: Patient will demonstrate decreased frequency of AVH or return to baseline function   Pt stated that AVH has gotten much better since admission.    Attendees:  Patient: Kevin Vance Family:  Physician: Merlyn Albert,  MD    03/09/2016 10:30AM  Nursing: Polly Cobia, RN     03/09/2016 10:30AM  Clinical Social Worker: Glorious Peach, [place]  03/09/2016 10:30AM  Other: Everitt Amber, Recreational Therapist  03/09/2016 10:30AM

## 2016-03-09 NOTE — Progress Notes (Signed)
Recreation Therapy Notes  Date: 08.23.17 Time: 1:00 pm Location: Craft Room  Group Topic: Self-esteem  Goal Area(s) Addresses:  Patient will identify positive traits about self. Patient will identify at least one healthy coping skill.  Behavioral Response: Intermittently Attentive, Inappropriate, Disruptive  Intervention: All About Me  Activity: Patients were instructed to make an All About Me pamphlet including their life's motto, positive traits, healthy coping skills, and their support system.  Education: LRT educated patients on ways they can increase their self-esteem.  Education Outcome: In group clarification offered  Clinical Observations/Feedback: Patient left group at approximately 1:09 pm stating, "I know people who have left and never went to a group." Patient returned to group at approximately 1:35 pm. Patient did not contribute to group discussion. Patient stated, "They should legalize marijuana in every state." LRT redirected patient and patient complied. Patient had side conversation with peers. LRT redirect patient and patient complied.  Leonette Monarch, LRT/CTRS 03/09/2016 3:07 PM

## 2016-03-09 NOTE — Progress Notes (Signed)
[facility] MD Progress Note  03/09/2016 1:27 PM Kevin Vance  MRN:  [id number]  Subjective:  Patient was seen today during assessment he was calm and cooperative. He reported having paranoia and visual hallucinations prior to admission. He is stated that he was noncompliant with medications. He appeared suspicious, tense and guarded during the assessment.  Yesterday he became very agitated while in the day room. He was coursing and yelling at staff. Patient has been argumentative and irritable towards Education officer, museum and nurses. He was disruptive in group yesterday. He was seen today during treatment team in he was argumentative with the nurse, his mood was irritable.  Says he is upset because he was sent waking up for meals. Nurse reports that they went to the room to try to wake him up multiple times but he did not respond.  Patient denies today having any side effects from medications. He denies having suicidality, homicidality or hallucinations. His been complaining of pain all his hand and would like to take medication for pain. I have ordered ibuprofen when necessary.  Per nursing: Mental Health techs approached nurse regarding pt being loud, belligerent in the day room while eating dinner. Tech reports pt became upset after being woke to eat dinner. Staff attempted to wake pt for dinner several times. Later on, pt came to dayroom upset first because he was woke, then became upset because he was late to dinner and his food was reportedly cold.   On assessment, pt was in the dayroom alone with 2 techs eating his meal tray yelling loudly, cursing. Nurse contacted Dr. Jerilee Hoh for PRN orders (see note).  When finished eating, pt came out of the dayroom continuing to yell loudly, cursing. Nurse approached pt with PRN medications, he refused and continued to walk to his room and yell loudly and belligerently.   Shortly after, pt came from room, yelling into the nurse's station that he "wanted a form to  write down his compliant's because staff is lazy and didn't wake me for dinner and my dinner was cold." Pt survey provided to pt, and he returned to his room.   Pt completed the survey and returned it to nurse, but continued to be loud, belligerent and curse "fuck this place and the lazy staff members" then walked away quietly.  Principal Problem: Schizoaffective disorder, bipolar type ([place]) Diagnosis:   Patient Active Problem List   Diagnosis Date Noted  . Noncompliance [Z91.19] 03/06/2016  . Schizoaffective disorder, bipolar type ([place]) [F25.0] 03/05/2016  . Tobacco use disorder [F17.200] 02/08/2010  . HEMANGIOMA OF SKIN AND SUBCUTANEOUS TISSUE [D18.01] 04/02/2008   Total Time spent with patient: 30 minutes  Past Psychiatric History: Bipolar disorder.   Past Medical History:  Past Medical History:  Diagnosis Date  . Bipolar 1 disorder ([place])   . Schizoaffective disorder [facility])     Past Surgical History:  Procedure Laterality Date  . ROTATOR CUFF REPAIR     Family History: History reviewed. No pertinent family history.  Family Psychiatric  History: See H&P.   Social History:  History  Alcohol Use No     History  Drug Use No    Comment: stopped    Social History   Social History  . Marital status: Single    Spouse name: N/A  . Number of children: N/A  . Years of education: N/A   Social History Main Topics  . Smoking status: Former Smoker    Quit date: 01/16/2016  . Smokeless tobacco: Never Used  .  Alcohol use No  . Drug use: No     Comment: stopped  . Sexual activity: Not Asked   Other Topics Concern  . None   Social History Narrative  . None     Current Medications: Current Facility-Administered Medications  Medication Dose Route Frequency Provider Last Rate Last Dose  . acetaminophen (TYLENOL) tablet 1,000 mg  1,000 mg Oral Q6H PRN Hildred Priest, MD      . alum & mag hydroxide-simeth (MAALOX/MYLANTA) 200-200-20 MG/5ML suspension 30 mL  30  mL Oral Q4H PRN Hildred Priest, MD      . diphenhydrAMINE (BENADRYL) capsule 50 mg  50 mg Oral Q8H PRN Hildred Priest, MD       Or  . diphenhydrAMINE (BENADRYL) injection 50 mg  50 mg Intramuscular Q8H PRN Hildred Priest, MD      . divalproex (DEPAKOTE) DR tablet 500 mg  500 mg Oral Q8H Jolanta B Pucilowska, MD   500 mg at 03/09/16 1316  . haloperidol (HALDOL) tablet 5 mg  5 mg Oral Q8H PRN Hildred Priest, MD       Or  . haloperidol lactate (HALDOL) injection 5 mg  5 mg Intramuscular Q8H PRN Hildred Priest, MD      . ibuprofen (ADVIL,MOTRIN) tablet 600 mg  600 mg Oral Q6H PRN Hildred Priest, MD      . LORazepam (ATIVAN) tablet 2 mg  2 mg Oral Q4H PRN Hildred Priest, MD       Or  . LORazepam (ATIVAN) injection 2 mg  2 mg Intramuscular Q4H PRN Hildred Priest, MD      . magnesium hydroxide (MILK OF MAGNESIA) suspension 30 mL  30 mL Oral Daily PRN Hildred Priest, MD      . nicotine (NICODERM CQ - dosed in mg/24 hours) patch 21 mg  21 mg Transdermal Daily Jolanta B Pucilowska, MD      . OLANZapine (ZYPREXA) tablet 20 mg  20 mg Oral QHS Hildred Priest, MD   20 mg at 03/08/16 2152  . traZODone (DESYREL) tablet 100 mg  100 mg Oral QHS Hildred Priest, MD   100 mg at 03/08/16 2151    Lab Results:  Results for orders placed or performed during the hospital encounter of 03/05/16 (from the past 48 hour(s))  Lipid panel     Status: Abnormal   Collection Time: 03/08/16  7:00 AM  Result Value Ref Range   Cholesterol 175 0 - 200 mg/dL   Triglycerides 230 (H) <150 mg/dL   HDL 36 (L) >40 mg/dL   Total CHOL/HDL Ratio 4.9 RATIO   VLDL 46 (H) 0 - 40 mg/dL   LDL Cholesterol 93 0 - 99 mg/dL    Comment:        Total Cholesterol/HDL:CHD Risk Coronary Heart Disease Risk Table                     Men   Women  1/2 Average Risk   3.4   3.3  Average Risk       5.0   4.4  2 X Average Risk   9.6    7.1  3 X Average Risk  23.4   11.0        Use the calculated Patient Ratio above and the CHD Risk Table to determine the patient's CHD Risk.        ATP III CLASSIFICATION (LDL):  <100     mg/dL   Optimal  100-129  mg/dL   Near  or Above                    Optimal  130-159  mg/dL   Borderline  160-189  mg/dL   High  >190     mg/dL   Very High   TSH     Status: None   Collection Time: 03/08/16  7:00 AM  Result Value Ref Range   TSH 4.089 0.350 - 4.500 uIU/mL  Prolactin     Status: Abnormal   Collection Time: 03/08/16  7:00 AM  Result Value Ref Range   Prolactin 28.4 (H) 4.0 - 15.2 ng/mL    Comment: (NOTE) Performed At: [facility] [address] [place], [place] [id number] Lindon Romp MD [id number]   Hemoglobin A1c     Status: None   Collection Time: 03/08/16  7:00 AM  Result Value Ref Range   Hgb A1c MFr Bld 5.5 4.0 - 6.0 %    Blood Alcohol level:  Lab Results  Component Value Date   ETH <5 03/03/2016   ETH <5 [id number]    Metabolic Disorder Labs: Lab Results  Component Value Date   HGBA1C 5.5 03/08/2016   Lab Results  Component Value Date   PROLACTIN 28.4 (H) 03/08/2016   Lab Results  Component Value Date   CHOL 175 03/08/2016   TRIG 230 (H) 03/08/2016   HDL 36 (L) 03/08/2016   CHOLHDL 4.9 03/08/2016   VLDL 46 (H) 03/08/2016   LDLCALC 93 03/08/2016    Physical Findings: AIMS: Facial and Oral Movements Muscles of Facial Expression: None, normal Lips and Perioral Area: None, normal Jaw: None, normal Tongue: None, normal,Extremity Movements Upper (arms, wrists, hands, fingers): None, normal Lower (legs, knees, ankles, toes): None, normal, Trunk Movements Neck, shoulders, hips: None, normal, Overall Severity Severity of abnormal movements (highest score from questions above): None, normal Incapacitation due to abnormal movements: None, normal Patient's awareness of abnormal movements (rate only patient's report): No Awareness,  Dental Status Current problems with teeth and/or dentures?: No Does patient usually wear dentures?: No  CIWA:    COWS:     Musculoskeletal: Strength & Muscle Tone: within normal limits Gait & Station: normal Patient leans: N/A  Psychiatric Specialty Exam: Physical Exam  Nursing note and vitals reviewed. Constitutional: He is oriented to person, place, and time. He appears well-developed and well-nourished.  HENT:  Head: Normocephalic and atraumatic.  Eyes: EOM are normal.  Neck: Normal range of motion.  Respiratory: Effort normal.  Musculoskeletal: Normal range of motion.  Neurological: He is alert and oriented to person, place, and time.    Review of Systems  Constitutional: Negative.   HENT: Negative.   Respiratory: Negative.   Cardiovascular: Negative.   Gastrointestinal: Negative.   Genitourinary: Negative.   Musculoskeletal: Negative.   Skin: Negative.   Neurological: Negative.   Endo/Heme/Allergies: Negative.   Psychiatric/Behavioral: Positive for hallucinations.  All other systems reviewed and are negative.   Blood pressure 126/75, pulse (!) 56, temperature 98.2 F (36.8 C), resp. rate 18, height 5\' 9"  (1.753 m), weight 82.6 kg (182 lb), SpO2 99 %.Body mass index is 26.88 kg/m.  General Appearance: Disheveled  Eye Contact:  Fair  Speech:  Normal Rate  Volume:  Normal  Mood:  Dysphoric  Affect:  Congruent  Thought Process:  Linear and Descriptions of Associations: Intact  Orientation:  Full (Time, Place, and Person)  Thought Content:  Delusions and Hallucinations: Visual  Suicidal Thoughts:  No  Homicidal Thoughts:  No  Memory:  Immediate;   Fair Recent;   Fair Remote;   Fair  Judgement:  Poor  Insight:  Shallow  Psychomotor Activity:  Psychomotor Retardation  Concentration:  Concentration: Fair and Attention Span: Fair  Recall:  [employer] of Knowledge:  Fair  Language:  Fair  Akathisia:  No  Handed:  Right  AIMS (if indicated):     Assets:   Communication Skills Desire for Improvement Housing Physical Health Resilience Social Support  ADL's:  Intact  Cognition:  WNL  Sleep:  Number of Hours: 5.75     Treatment Plan Summary: Daily contact with patient to assess and evaluate symptoms and progress in treatment and Medication management   Kevin Vance is a 25 year old male with a history of bipolar illness admitted in a manic, psychotic episode in the context of treatment noncompliance.  Mood and psychosis. He was started on Zyprexa and Depakote.  -Continue olanzapine which has been increased to 20 mg qhs -Continue depakote 500 mg tid (refused depakote on Monday but started taking it on Tuesday)--Will check depakote level this weekend  For agitation and aggression he has been started on haldol, ativan and benadryl PO or IM prn  Insomnia: continue trazodone 100 mg po qhs, slept 5.7 h last night  Pain: c/o pain in his hand due to arthritis.  Will order tylenol and ibuprofen prn.  Metabolic syndrome monitoring. Lipid profile, TSH, hemoglobin A1c and prolactin have been completed  Smoking. Nicotine patch is available.  Disposition. He will be discharged to home with his mother. Follow-up with his psychiatrist in [place].  Past h/o of abnormal EKGL: EKG on 8/22 nl sinus rhythm, no QTC prolongation.   Hildred Priest, MD 03/09/2016, 1:27 PM

## 2016-03-10 NOTE — [facility] Group Notes (Signed)
[facility] Group Notes:  (Nursing/MHT/Case Management/Adjunct)  Date:  03/10/2016  Time:  4:19 PM  Type of Therapy:  Psychoeducational Skills  Participation Level:  Did Not Attend  Charise Killian 03/10/2016, 4:19 PM

## 2016-03-10 NOTE — Progress Notes (Signed)
D: Patient is less irritable this evening and less belligerent towards staff. He did make some bizarre statements towards this Probation officer stating, "is your name Clearnce Sorrel?" and then referenced this other person he knows and asked if I was married to him. He also stated he needed something for anxiety because he was delusional. He denied SI/HI/AVH. A: Medication given with education. Encouragement provided. When offering him Ativan for anxiety he stated,"No that's over the counter stuff." Patient was educated that it was not but then he refused.  R: Patient was compliant with medication. Safety maintained with 15 min checks.

## 2016-03-10 NOTE — Plan of Care (Signed)
Problem: Safety: Goal: Ability to redirect hostility and anger into socially appropriate behaviors will improve Outcome: Not Progressing Patient is argumentative

## 2016-03-10 NOTE — Progress Notes (Signed)
D: Patient is still irritable and belligerent towards staff. He denied SI/HI/AVH. When asking about pain he stated "yes, but you're not going to give me what I want." A: Medication given with education. Encouragement provided.  R: Patient was compliant with medication. Safety maintained with 15 min checks.

## 2016-03-10 NOTE — Progress Notes (Signed)
Patient remained in bed until lunch time.  Patient argumentative and confrontational.  [place] halls and noted talking to himself.  Support and encouragement offered.   Safety maintained.

## 2016-03-10 NOTE — Plan of Care (Signed)
Problem: Safety: Goal: Ability to remain free from injury will improve Outcome: Progressing Patient has remained free from injury during this shift.   

## 2016-03-10 NOTE — Progress Notes (Signed)
[facility] MD Progress Note  03/10/2016 9:14 AM Kevin Vance  MRN:  [id number]  Subjective:  Patient was seen today during assessment he was calm and cooperative. He reported having paranoia and visual hallucinations prior to admission. He is stated that he was noncompliant with medications.   On Tuesday he became very agitated while in the day room. He was coursing and yelling at staff. Patient has been argumentative and irritable towards Education officer, museum and nurses. He was disruptive in group on Tuesday. He was seen on Wednesday during treatment team in he was argumentative with the nurse, his mood was irritable.  Says he was upset because he was not woken up for meals. Nurse reports that they went to the room to try to wake him up multiple times but he did not respond.  Patient denies today having any side effects from medications. He denies having suicidality, homicidality or hallucinations. Says his mood is much better and does not feel angry.  Pt reports sleeping and eating well.  Would like to be d/c home soon.  We discussed possibility of d/c on Saturday after depakote levels.  He agrees with this plan  Per nursing: D: Patient is still irritable and belligerent towards staff. He denied SI/HI/AVH. When asking about pain he stated "yes, but you're not going to give me what I want." A: Medication given with education. Encouragement provided.  R: Patient was compliant with medication. Safety maintained with 15 min checks.   Principal Problem: Schizoaffective disorder, bipolar type ([place]) Diagnosis:   Patient Active Problem List   Diagnosis Date Noted  . Noncompliance [Z91.19] 03/06/2016  . Schizoaffective disorder, bipolar type ([place]) [F25.0] 03/05/2016  . Tobacco use disorder [F17.200] 02/08/2010  . HEMANGIOMA OF SKIN AND SUBCUTANEOUS TISSUE [D18.01] 04/02/2008   Total Time spent with patient: 30 minutes  Past Psychiatric History: Bipolar disorder.   Past Medical History:  Past Medical History:   Diagnosis Date  . Bipolar 1 disorder ([place])   . Schizoaffective disorder [facility])     Past Surgical History:  Procedure Laterality Date  . ROTATOR CUFF REPAIR     Family History: History reviewed. No pertinent family history.  Family Psychiatric  History: See H&P.   Social History:  History  Alcohol Use No     History  Drug Use No    Comment: stopped    Social History   Social History  . Marital status: Single    Spouse name: N/A  . Number of children: N/A  . Years of education: N/A   Social History Main Topics  . Smoking status: Former Smoker    Quit date: 01/16/2016  . Smokeless tobacco: Never Used  . Alcohol use No  . Drug use: No     Comment: stopped  . Sexual activity: Not Asked   Other Topics Concern  . None   Social History Narrative  . None     Current Medications: Current Facility-Administered Medications  Medication Dose Route Frequency Provider Last Rate Last Dose  . acetaminophen (TYLENOL) tablet 1,000 mg  1,000 mg Oral Q6H PRN Hildred Priest, MD      . alum & mag hydroxide-simeth (MAALOX/MYLANTA) 200-200-20 MG/5ML suspension 30 mL  30 mL Oral Q4H PRN Hildred Priest, MD      . diphenhydrAMINE (BENADRYL) capsule 50 mg  50 mg Oral Q8H PRN Hildred Priest, MD       Or  . diphenhydrAMINE (BENADRYL) injection 50 mg  50 mg Intramuscular Q8H PRN Hildred Priest, MD      .  divalproex (DEPAKOTE) DR tablet 500 mg  500 mg Oral Q8H Jolanta B Pucilowska, MD   500 mg at 03/10/16 0645  . haloperidol (HALDOL) tablet 5 mg  5 mg Oral Q8H PRN Hildred Priest, MD       Or  . haloperidol lactate (HALDOL) injection 5 mg  5 mg Intramuscular Q8H PRN Hildred Priest, MD      . ibuprofen (ADVIL,MOTRIN) tablet 600 mg  600 mg Oral Q6H PRN Hildred Priest, MD      . LORazepam (ATIVAN) tablet 2 mg  2 mg Oral Q4H PRN Hildred Priest, MD       Or  . LORazepam (ATIVAN) injection 2 mg  2 mg  Intramuscular Q4H PRN Hildred Priest, MD      . magnesium hydroxide (MILK OF MAGNESIA) suspension 30 mL  30 mL Oral Daily PRN Hildred Priest, MD      . nicotine (NICODERM CQ - dosed in mg/24 hours) patch 21 mg  21 mg Transdermal Daily Jolanta B Pucilowska, MD      . OLANZapine (ZYPREXA) tablet 20 mg  20 mg Oral QHS Hildred Priest, MD   20 mg at 03/09/16 2120  . traZODone (DESYREL) tablet 100 mg  100 mg Oral QHS Hildred Priest, MD   100 mg at 03/09/16 2120    Lab Results:  No results found for this or any previous visit (from the past 48 hour(s)).  Blood Alcohol level:  Lab Results  Component Value Date   [facility] <5 03/03/2016   ETH <5 [id number]    Metabolic Disorder Labs: Lab Results  Component Value Date   HGBA1C 5.5 03/08/2016   Lab Results  Component Value Date   PROLACTIN 28.4 (H) 03/08/2016   Lab Results  Component Value Date   CHOL 175 03/08/2016   TRIG 230 (H) 03/08/2016   HDL 36 (L) 03/08/2016   CHOLHDL 4.9 03/08/2016   VLDL 46 (H) 03/08/2016   LDLCALC 93 03/08/2016    Physical Findings: AIMS: Facial and Oral Movements Muscles of Facial Expression: None, normal Lips and Perioral Area: None, normal Jaw: None, normal Tongue: None, normal,Extremity Movements Upper (arms, wrists, hands, fingers): None, normal Lower (legs, knees, ankles, toes): None, normal, Trunk Movements Neck, shoulders, hips: None, normal, Overall Severity Severity of abnormal movements (highest score from questions above): None, normal Incapacitation due to abnormal movements: None, normal Patient's awareness of abnormal movements (rate only patient's report): No Awareness, Dental Status Current problems with teeth and/or dentures?: No Does patient usually wear dentures?: No  CIWA:    COWS:     Musculoskeletal: Strength & Muscle Tone: within normal limits Gait & Station: normal Patient leans: N/A  Psychiatric Specialty Exam: Physical Exam   Nursing note and vitals reviewed. Constitutional: He is oriented to person, place, and time. He appears well-developed and well-nourished.  HENT:  Head: Normocephalic and atraumatic.  Eyes: EOM are normal.  Neck: Normal range of motion.  Respiratory: Effort normal.  Musculoskeletal: Normal range of motion.  Neurological: He is alert and oriented to person, place, and time.    Review of Systems  Constitutional: Negative.   HENT: Negative.   Respiratory: Negative.   Cardiovascular: Negative.   Gastrointestinal: Negative.   Genitourinary: Negative.   Musculoskeletal: Negative.   Skin: Negative.   Neurological: Negative.   Endo/Heme/Allergies: Negative.   Psychiatric/Behavioral: Negative for hallucinations, memory loss, substance abuse and suicidal ideas. The patient is not nervous/anxious and does not have insomnia.   All other systems reviewed and are  negative.   Blood pressure 119/68, pulse (!) 58, temperature 97.8 F (36.6 C), temperature source Oral, resp. rate 18, height 5\' 9"  (1.753 m), weight 82.6 kg (182 lb), SpO2 99 %.Body mass index is 26.88 kg/m.  General Appearance: Disheveled  Eye Contact:  Fair  Speech:  Normal Rate  Volume:  Normal  Mood:  Dysphoric  Affect:  Congruent  Thought Process:  Linear and Descriptions of Associations: Intact  Orientation:  Full (Time, Place, and Person)  Thought Content:  Delusions and Hallucinations: Visual  Suicidal Thoughts:  No  Homicidal Thoughts:  No  Memory:  Immediate;   Fair Recent;   Fair Remote;   Fair  Judgement:  Poor  Insight:  Shallow  Psychomotor Activity:  Psychomotor Retardation  Concentration:  Concentration: Fair and Attention Span: Fair  Recall:  [employer] of Knowledge:  Fair  Language:  Fair  Akathisia:  No  Handed:  Right  AIMS (if indicated):     Assets:  Communication Skills Desire for Improvement Housing Physical Health Resilience Social Support  ADL's:  Intact  Cognition:  WNL  Sleep:   Number of Hours: 6.25     Treatment Plan Summary: Daily contact with patient to assess and evaluate symptoms and progress in treatment and Medication management   Mr. Carrazco is a 25 year old male with a history of bipolar illness admitted in a manic, psychotic episode in the context of treatment noncompliance.  Mood and psychosis. He was started on Zyprexa and Depakote.  -Continue olanzapine  20 mg qhs -Continue depakote 500 mg tid (refused depakote on Monday but started taking it on Tuesday)--Will check depakote level on Saturday am  For agitation and aggression he has been started on haldol, ativan and benadryl PO or IM prn  Insomnia: continue trazodone 100 mg po qhs, slept 6 h last night  Pain: c/o pain in his hand due to arthritis.  Will order tylenol and ibuprofen prn.  Metabolic syndrome monitoring. Lipid profile, TSH, hemoglobin A1c and prolactin have been completed  Smoking. Nicotine patch is available.  Disposition. He will be discharged to home with his mother. Follow-up with his psychiatrist in [place].  Past h/o of abnormal EKGL: EKG on 8/22 nl sinus rhythm, no QTC prolongation.   Plan to d/c on Saturday am after depakote level  Hildred Priest, MD 03/10/2016, 9:14 AM

## 2016-03-10 NOTE — BHH Group Notes (Signed)
[facility] LCSW Group Therapy  03/10/2016 2:08 PM  Type of Therapy:  Group Therapy  Participation Level:  None  Participation Quality:  Inattentive  Affect:  Anxious  Cognitive:  Lacking  Insight:  None  Engagement in Therapy:  None  Modes of Intervention:  Discussion, Education and Support  Summary of Progress/Problems:Balance in life: Patients will discuss the concept of balance and how it looks and feels to be unbalanced. Pt will identify areas in their life that is unbalanced and ways to become more balanced. Pt had difficulty sitting still during group and left out 2x. Pt stayed in group for a few minutes each time but did not participate in the group discussion. Pt did not return after he left the second time.    Mayukha Symmonds G. [place], [place] 03/10/2016, 2:11 PM

## 2016-03-10 NOTE — Progress Notes (Signed)
Recreation Therapy Notes  Date: 08.24.17 Time: 9:30 am Location: Craft Room  Group Topic: Leisure Education  Goal Area(s) Addresses:  Patient will identify things they are grateful for. Patient will be educated on why it is important to be grateful.  Behavioral Response: Did not attend  Intervention: Grateful Wheel  Activity: Patients were given an I Am Grateful For worksheet and instructed to write things they were grateful for under each category.  Education: LRT educated patients on why it is important to be grateful.   Education Outcome: Patient did not attend group.   Clinical Observations/Feedback: Patient did not attend group.  Leonette Monarch, LRT/CTRS 03/10/2016 10:25 AM

## 2016-03-10 NOTE — BHH Group Notes (Signed)
[facility] LCSW Group Therapy Note  Type of Therapy and Topic:  Group Therapy:  Goals Group: SMART Goals  Participation Level: Pt did not attend group. CSW invited pt to group.   Description of Group:    The purpose of a daily goals group is to assist and guide patients in setting recovery/wellness-related goals.  The objective is to set goals as they relate to the crisis in which they were admitted. Patients will be using SMART goal modalities to set measurable goals.  Characteristics of realistic goals will be discussed and patients will be assisted in setting and processing how one will reach their goal. Facilitator will also assist patients in applying interventions and coping skills learned in psycho-education groups to the SMART goal and process how one will achieve defined goal.  Therapeutic Goals: -Patients will develop and document one goal related to or their crisis in which brought them into treatment. -Patients will be guided by LCSW using SMART goal setting modality in how to set a measurable, attainable, realistic and time sensitive goal.  -Patients will process barriers in reaching goal. -Patients will process interventions in how to overcome and successful in reaching goal.   Summary of Patient Progress:  Patient Goal: Pt did not attend group. CSW invited pt to group.      Therapeutic Modalities:   Motivational Interviewing  Public relations account executive Therapy Crisis Intervention Model SMART goals setting  Zea Kostka G. [place], [facility] 03/10/2016 11:42 AM

## 2016-03-10 NOTE — Plan of Care (Signed)
Problem: Safety: Goal: Ability to remain free from injury will improve Outcome: Progressing Patient has remained free of injury during this shift.

## 2016-03-11 MED ORDER — DIVALPROEX SODIUM ER 500 MG PO TB24: 2000.0000 mg | ORAL_TABLET | Freq: Every day | ORAL | Status: DC

## 2016-03-11 MED ORDER — OLANZAPINE 20 MG PO TABS: 20.0000 mg | ORAL_TABLET | Freq: Every day | ORAL | 0 refills | Status: DC

## 2016-03-11 MED ORDER — DIVALPROEX SODIUM ER 500 MG PO TB24: 2000.0000 mg | ORAL_TABLET | Freq: Every day | ORAL | 0 refills | Status: DC

## 2016-03-11 MED ORDER — LORAZEPAM 1 MG PO TABS: 1.0000 mg | ORAL_TABLET | Freq: Every evening | ORAL | 0 refills | Status: DC | PRN

## 2016-03-11 MED ORDER — LORAZEPAM 0.5 MG PO TABS
1.0000 mg | ORAL_TABLET | Freq: Every day | ORAL | Status: DC
  Administered 2016-03-11: 1 mg via ORAL
  Filled 2016-03-11: qty 2

## 2016-03-11 MED ORDER — DIVALPROEX SODIUM 500 MG PO DR TAB
500.0000 mg | DELAYED_RELEASE_TABLET | Freq: Three times a day (TID) | ORAL | Status: AC
  Administered 2016-03-11 (×2): 500 mg via ORAL
  Filled 2016-03-11: qty 1

## 2016-03-11 NOTE — Progress Notes (Signed)
Recreation Therapy Notes  Date: 08.25.17 Time: 9:30 am Location: Craft Room  Group Topic: Coping Skills  Goal Area(s) Addresses:  Patient will participate in healthy coping skill. Patient will verbalize use of art as a coping skill.  Behavioral Response: Did not attend  Intervention: Coloring  Activity: Patients were given coloring sheets to color. Patients were instructed to think about the emotions they were feeling and what their mind was focused on while they were coloring.  Education: LRT educated patients on healthy coping skills.  Education Outcome: Patient did not attend group.   Clinical Observations/Feedback: Patient did not attend group.  Leonette Monarch, LRT/CTRS 03/11/2016 10:11 AM

## 2016-03-11 NOTE — BHH Suicide Risk Assessment (Signed)
[facility] Discharge Suicide Risk Assessment   Principal Problem: Schizoaffective disorder, bipolar type [facility]) Discharge Diagnoses:  Patient Active Problem List   Diagnosis Date Noted  . Noncompliance [Z91.19] 03/06/2016  . Schizoaffective disorder, bipolar type ([place]) [F25.0] 03/05/2016  . Tobacco use disorder [F17.200] 02/08/2010  . HEMANGIOMA OF SKIN AND SUBCUTANEOUS TISSUE [D18.01] 04/02/2008      Psychiatric Specialty Exam: ROS  Blood pressure 118/64, pulse (!) 54, temperature 97.8 F (36.6 C), temperature source Oral, resp. rate 18, height 5\' 9"  (1.753 m), weight 82.6 kg (182 lb), SpO2 99 %.Body mass index is 26.88 kg/m.                                                        Mental Status Per Nursing Assessment::   On Admission:  NA  Demographic Factors:  Male and Caucasian  Loss Factors: NA  Historical Factors: Impulsivity  Risk Reduction Factors:   Living with another person, especially a relative and Positive social support  Continued Clinical Symptoms:  Personality Disorders:   Cluster B Previous Psychiatric Diagnoses and Treatments  Cognitive Features That Contribute To Risk:  Closed-mindedness    Suicide Risk:  Minimal: No identifiable suicidal ideation.  Patients presenting with no risk factors but with morbid ruminations; may be classified as minimal risk based on the severity of the depressive symptoms  Follow-up Information    [facility]. Go on 03/18/2016.   Why:  Please arrive to your appointment to see Dr. [name] for medication management. It is important to take your discharge paperwork to this appointment to discuss medication adjustments and concerns. Arrive as early as possible for prompt services. Contact information: Address: [address], [place], [place] [address] Hours: 8AM-5PM Phone: [phone] Fax: [phone]           Hildred Priest, MD 03/12/2016, 7:37  AM

## 2016-03-11 NOTE — Progress Notes (Signed)
[facility] MD Progress Note  03/11/2016 1:14 PM Shelvy Gilton  MRN:  [id number]  Subjective:  Patient was seen today during assessment he was calm and cooperative. He reported having paranoia and visual hallucinations prior to admission. He is stated that he was noncompliant with medications.   On Tuesday he became very agitated while in the day room. He was coursing and yelling at staff. Patient has been argumentative and irritable towards Education officer, museum and nurses. He was disruptive in group on Tuesday. He was seen on Wednesday during treatment team in he was argumentative with the nurse, his mood was irritable.  Says he was upset because he was not woken up for meals. Nurse reports that they went to the room to try to wake him up multiple times but he did not respond.  Patient denies today having any side effects from medications. He denies having suicidality, homicidality or hallucinations. Says his mood is much better and does not feel angry.  Pt reports sleeping and eating well.  Would like to be d/c home soon.  We discussed possibility of d/c on Saturday after depakote levels.  He agrees with this plan  Per nursing: D: Patient is less irritable this evening and less belligerent towards staff. He did make some bizarre statements towards this Probation officer stating, "is your name Clearnce Sorrel?" and then referenced this other person he knows and asked if I was married to him. He also stated he needed something for anxiety because he was delusional. He denied SI/HI/AVH. A: Medication given with education. Encouragement provided. When offering him Ativan for anxiety he stated,"No that's over the counter stuff." Patient was educated that it was not but then he refused.  R: Patient was compliant with medication. Safety maintained with 15 min checks  Principal Problem: Schizoaffective disorder, bipolar type ([place]) Diagnosis:   Patient Active Problem List   Diagnosis Date Noted  . Noncompliance [Z91.19] 03/06/2016  .  Schizoaffective disorder, bipolar type ([place]) [F25.0] 03/05/2016  . Tobacco use disorder [F17.200] 02/08/2010  . HEMANGIOMA OF SKIN AND SUBCUTANEOUS TISSUE [D18.01] 04/02/2008   Total Time spent with patient: 30 minutes  Past Psychiatric History: Bipolar disorder.   Past Medical History:  Past Medical History:  Diagnosis Date  . Bipolar 1 disorder ([place])   . Schizoaffective disorder [facility])     Past Surgical History:  Procedure Laterality Date  . ROTATOR CUFF REPAIR     Family History: History reviewed. No pertinent family history.  Family Psychiatric  History: See H&P.   Social History:  History  Alcohol Use No     History  Drug Use No    Comment: stopped    Social History   Social History  . Marital status: Single    Spouse name: N/A  . Number of children: N/A  . Years of education: N/A   Social History Main Topics  . Smoking status: Former Smoker    Quit date: 01/16/2016  . Smokeless tobacco: Never Used  . Alcohol use No  . Drug use: No     Comment: stopped  . Sexual activity: Not Asked   Other Topics Concern  . None   Social History Narrative  . None     Current Medications: Current Facility-Administered Medications  Medication Dose Route Frequency Provider Last Rate Last Dose  . acetaminophen (TYLENOL) tablet 1,000 mg  1,000 mg Oral Q6H PRN Hildred Priest, MD      . alum & mag hydroxide-simeth (MAALOX/MYLANTA) 200-200-20 MG/5ML suspension 30 mL  30 mL  Oral Q4H PRN Hildred Priest, MD      . Derrill Memo ON 03/12/2016] divalproex (DEPAKOTE ER) 24 hr tablet 2,000 mg  2,000 mg Oral QHS Hildred Priest, MD      . divalproex (DEPAKOTE) DR tablet 500 mg  500 mg Oral Q8H Hildred Priest, MD      . ibuprofen (ADVIL,MOTRIN) tablet 600 mg  600 mg Oral Q6H PRN Hildred Priest, MD      . LORazepam (ATIVAN) tablet 2 mg  2 mg Oral Q4H PRN Hildred Priest, MD       Or  . LORazepam (ATIVAN) injection 2 mg  2 mg  Intramuscular Q4H PRN Hildred Priest, MD      . magnesium hydroxide (MILK OF MAGNESIA) suspension 30 mL  30 mL Oral Daily PRN Hildred Priest, MD      . nicotine (NICODERM CQ - dosed in mg/24 hours) patch 21 mg  21 mg Transdermal Daily Jolanta B Pucilowska, MD      . OLANZapine (ZYPREXA) tablet 20 mg  20 mg Oral QHS Hildred Priest, MD   20 mg at 03/10/16 2134    Lab Results:  No results found for this or any previous visit (from the past 73 hour(s)).  Blood Alcohol level:  Lab Results  Component Value Date   [facility] <5 03/03/2016   ETH <5 [id number]    Metabolic Disorder Labs: Lab Results  Component Value Date   HGBA1C 5.5 03/08/2016   Lab Results  Component Value Date   PROLACTIN 28.4 (H) 03/08/2016   Lab Results  Component Value Date   CHOL 175 03/08/2016   TRIG 230 (H) 03/08/2016   HDL 36 (L) 03/08/2016   CHOLHDL 4.9 03/08/2016   VLDL 46 (H) 03/08/2016   LDLCALC 93 03/08/2016    Physical Findings: AIMS: Facial and Oral Movements Muscles of Facial Expression: None, normal Lips and Perioral Area: None, normal Jaw: None, normal Tongue: None, normal,Extremity Movements Upper (arms, wrists, hands, fingers): None, normal Lower (legs, knees, ankles, toes): None, normal, Trunk Movements Neck, shoulders, hips: None, normal, Overall Severity Severity of abnormal movements (highest score from questions above): None, normal Incapacitation due to abnormal movements: None, normal Patient's awareness of abnormal movements (rate only patient's report): No Awareness, Dental Status Current problems with teeth and/or dentures?: No Does patient usually wear dentures?: No  CIWA:    COWS:     Musculoskeletal: Strength & Muscle Tone: within normal limits Gait & Station: normal Patient leans: N/A  Psychiatric Specialty Exam: Physical Exam  Nursing note and vitals reviewed. Constitutional: He is oriented to person, place, and time. He appears  well-developed and well-nourished.  HENT:  Head: Normocephalic and atraumatic.  Eyes: EOM are normal.  Neck: Normal range of motion.  Respiratory: Effort normal.  Musculoskeletal: Normal range of motion.  Neurological: He is alert and oriented to person, place, and time.    Review of Systems  Constitutional: Negative.   HENT: Negative.   Respiratory: Negative.   Cardiovascular: Negative.   Gastrointestinal: Negative.   Genitourinary: Negative.   Musculoskeletal: Negative.   Skin: Negative.   Neurological: Negative.   Endo/Heme/Allergies: Negative.   Psychiatric/Behavioral: Negative for hallucinations, memory loss, substance abuse and suicidal ideas. The patient is not nervous/anxious and does not have insomnia.   All other systems reviewed and are negative.   Blood pressure 126/83, pulse (!) 47, temperature 97.6 F (36.4 C), temperature source Oral, resp. rate 18, height 5\' 9"  (1.753 m), weight 82.6 kg (182 lb), SpO2 99 %.Body  mass index is 26.88 kg/m.  General Appearance: Disheveled  Eye Contact:  Fair  Speech:  Normal Rate  Volume:  Normal  Mood:  Dysphoric  Affect:  Congruent  Thought Process:  Linear and Descriptions of Associations: Intact  Orientation:  Full (Time, Place, and Person)  Thought Content:  Delusions and Hallucinations: Visual  Suicidal Thoughts:  No  Homicidal Thoughts:  No  Memory:  Immediate;   Fair Recent;   Fair Remote;   Fair  Judgement:  Poor  Insight:  Shallow  Psychomotor Activity:  Psychomotor Retardation  Concentration:  Concentration: Fair and Attention Span: Fair  Recall:  [employer] of Knowledge:  Fair  Language:  Fair  Akathisia:  No  Handed:  Right  AIMS (if indicated):     Assets:  Communication Skills Desire for Improvement Housing Physical Health Resilience Social Support  ADL's:  Intact  Cognition:  WNL  Sleep:  Number of Hours: 5.75     Treatment Plan Summary: Daily contact with patient to assess and evaluate  symptoms and progress in treatment and Medication management   Mr. Hiatt is a 25 year old male with a history of bipolar illness admitted in a manic, psychotic episode in the context of treatment noncompliance.  Mood and psychosis. He was started on Zyprexa and Depakote.  -Continue olanzapine  20 mg qhs -Continue depakote 500 mg tid (refused depakote on Monday but started taking it on Tuesday)--Will check depakote level on Saturday am.  On discharge I will change depakote to ER as pt has c/o GI SE with DR depakote in the past.  For agitation and aggression: continue ativan 2 mg PO or IM prn  Insomnia: Pt slept 5.7 h last night.  Will order ativan qhs   Pain: c/o pain in his hand due to arthritis.  Will order tylenol and ibuprofen prn.  Metabolic syndrome monitoring. Lipid profile, TSH, hemoglobin A1c and prolactin have been completed  Smoking. Nicotine patch is available.  Disposition. He will be discharged to home with his mother. Follow-up with his psychiatrist in [place].  Past h/o of abnormal EKGL: EKG on 8/22 nl sinus rhythm, no QTC prolongation.   Plan to d/c on Saturday am after depakote level  VS: will have staff to recheck vitals  Hildred Priest, MD 03/11/2016, 1:14 PM

## 2016-03-11 NOTE — Tx Team (Signed)
Interdisciplinary Treatment Plan Update (Adult)         Date: 03/11/2016   Time Reviewed: 10:30 AM   Progress in Treatment: Improving Attending groups: Yes  Participating in groups: Yes  Taking medication as prescribed: Yes  Tolerating medication: Yes  Family/Significant other contact made: Yes, CSW has spoken with mother. Patient understands diagnosis: Yes  Discussing patient identified problems/goals with staff: Yes  Medical problems stabilized or resolved: Yes  Denies suicidal/homicidal ideation: Yes  Issues/concerns per patient self-inventory: Yes  Other:   New problem(s) identified: N/A   Discharge Plan or Barriers: see below   Reason for Continuation of Hospitalization:   Depression   Anxiety   Medication Stabilization   Comments: N/A   Estimated length of stay: 3-5 days    Patient is a 25 year old male admitted for bipolar disorder and psychosis. Patient lives in [place], [place]. Patient will benefit from crisis stabilization, medication evaluation, group therapy, and psycho education in addition to case management for discharge planning. Patient and CSW reviewed pt's identified goals and treatment plan. Pt verbalized understanding and agreed to treatment plan.    Review of initial/current patient goals per problem list:  1. Goal(s): Patient will participate in aftercare plan   Met: Yes  Target date: 3-5 days post admission date   As evidenced by: Patient will participate within aftercare plan AEB aftercare provider and housing plan at discharge being identified.   Patient will follow-up with [facility].  2. Goal (s): Patient will exhibit decreased depressive symptoms and suicidal ideations.   Met: Yes  Target date: 3-5 days post admission date   As evidenced by: Patient will utilize self-rating of depression at 3 or below and demonstrate decreased signs of depression or be deemed stable for discharge by MD.   Pt reports a depression  score of 3 at this time. Denies SI/HI at this time.  3. Goal(s): Patient will demonstrate decreased signs and symptoms of anxiety.   Met: Yes  Target date: 3-5 days post admission date   As evidenced by: Patient will utilize self-rating of anxiety at 3 or below and demonstrated decreased signs of anxiety, or be deemed stable for discharge by MD   Pt reports an anxiety score of 2 at this time.    4. Goal(s): Patient will demonstrate decreased signs of psychosis  * Met: Yes * Target date: 3-5 days post admission date  * As evidenced by: Patient will demonstrate decreased frequency of AVH or return to baseline function   Pt denies AVH at this time.  Attendees:  Patient: Kevin Vance Family:  Physician: Merlyn Albert,  MD    03/11/2016 10:30AM  Nursing: Elige Radon, RN     03/11/2016 10:30AM  Clinical Social Worker: Glorious Peach, [place]  03/11/2016 10:30AM  Other: Everitt Amber, Recreational Therapist  03/11/2016 10:30AM

## 2016-03-11 NOTE — Discharge Summary (Signed)
Physician Discharge Summary Note  Patient:  Kevin Vance is an 25 y.o., male MRN:  [id number] DOB:  10-25-1990 Patient phone:  [phone] (home)  Patient address:   [address] [place] [address],  Total Time spent with patient: 30 minutes  Date of Admission:  03/05/2016 Date of Discharge: 03/12/16  Reason for Admission:  psychosis  Principal Problem: Schizoaffective disorder, bipolar type [facility]) Discharge Diagnoses: Patient Active Problem List   Diagnosis Date Noted  . Noncompliance [Z91.19] 03/06/2016  . Schizoaffective disorder, bipolar type ([place]) [F25.0] 03/05/2016  . Tobacco use disorder [F17.200] 02/08/2010  . HEMANGIOMA OF SKIN AND SUBCUTANEOUS TISSUE [D18.01] 04/02/2008    History of Present Illness:  Kevin Vance is a 25 yo Caucasian male who was transferred from the [facility] ED under IVC. The patient was seen face-to-face today with Dr. Parke Poisson. The patient states "I got into an argument with my grandma." He states he has "a lot of built of anger" and "I snapped." He states he has "tried to fight my neighbors." He reports a past psychiatric history of bipolar disorder. He stopped taking his medications 4-5 months ago because "I had some problems people who wanted to fight me" and he felt the medication might slow him down. He states he is still seen by [name], PA at [facility]. He states he has been on adderall, seroquel and depakote in the past.   He states his biggest issues today are paranoia and hallucinations. He feels like he needs to be back on his medications. He states he was last hospitalized at [facility] when he was 25 years of age. He states he occasionally drinks alcohol; his BAL <5. He states he smokes marijuana twice a week; UDS is positive for THC. He denies suicidal or homicidal ideation, intent or plan.    He remained to be patient reported that he continues to have paranoia and hallucination. He was trying to change  his life by talking to the [place] and he came into his life. He reported that he has been using drugs including THC and alcohol. Patient appears to be responding to internal stimuli during the interview. He is unable to contract for safety at this time.   Associated Signs/Symptoms: Depression Symptoms:  depressed mood, psychomotor agitation, fatigue, feelings of worthlessness/guilt, difficulty concentrating, (Hypo) Manic Symptoms:  Distractibility, Impulsivity, Irritable Mood, Labiality of Mood, Anxiety Symptoms:  Excessive Worry, Psychotic Symptoms:  Delusions, Ideas of Reference, Paranoia, PTSD Symptoms: Negative NA Total Time spent with patient: 1 hour  Past Psychiatric History:  Has felt empty inside and tried to kill     Past Medical History:  Past Medical History:  Diagnosis Date  . Bipolar 1 disorder ([place])   . Schizoaffective disorder [facility])     Past Surgical History:  Procedure Laterality Date  . ROTATOR CUFF REPAIR     Family History: History reviewed. No pertinent family history.   Family Psychiatric  History: unknown  Social History:  History  Alcohol Use No     History  Drug Use No    Comment: stopped    Social History   Social History  . Marital status: Single    Spouse name: N/A  . Number of children: N/A  . Years of education: N/A   Social History Main Topics  . Smoking status: Former Smoker    Quit date: 01/16/2016  . Smokeless tobacco: Never Used  . Alcohol use No  . Drug use: No  Comment: stopped  . Sexual activity: Not Asked   Other Topics Concern  . None   Social History Narrative  . None    Hospital Course:    Daily contact with patient to assess and evaluate symptoms and progress in treatment and Medication management   Mr. Selover is a 25 year old male with a history of bipolar illness admitted in a manic, psychotic episode in the context of treatment noncompliance.  Mood and psychosis. He was started on Zyprexa  and Depakote.  -Continue olanzapine  20 mg qhs -Continue depakote ER 1500 qhs (During the hospital stay he received Depakote DR 500 mg 3 times a day. His Depakote level today was 90. As patient does not like to take the Depakote several times a day with decided to change it to the extended release).  Also Depakote ER is equivalent to a lower dose of Depakote which is desirable in this case as his level was 90.  Insomnia: Patient will be discharged on Ativan 1 mg by mouth daily at bedtime when necessary / 15 tablets  Pain: c/o pain in his hand due to arthritis.   patient has been receiving ibuprofen and Tylenol when necessary  Metabolic syndrome monitoring. Lipid profile, TSH, hemoglobin A1c and prolactin have been completed  Smoking. Nicotine patch is available.  Disposition. He will be discharged to home with his mother. Follow-up with his psychiatrist in [place].  Past h/o of abnormal EKGL: EKG on 8/22 nl sinus rhythm, no QTC prolongation.   Today the patient reports having significant improvement in mood. He no longer is poisoned depression, suicidality disorder suspiciousness. He feels that the medications are helping him keep his symptoms under control. Denies side effects from medications. He denies any physical complaints. He denies problems with sleep, appetite, energy or concentration.  Patient had for participation in programming. He did not display any unsafe behaviors while in the unit. He had a few episodes of disruptive behavior where he was cussing and yelling at staff.  Patient did not require seclusion, restraints or forced medications.   Physical Findings: AIMS: Facial and Oral Movements Muscles of Facial Expression: None, normal Lips and Perioral Area: None, normal Jaw: None, normal Tongue: None, normal,Extremity Movements Upper (arms, wrists, hands, fingers): None, normal Lower (legs, knees, ankles, toes): None, normal, Trunk Movements Neck, shoulders, hips:  None, normal, Overall Severity Severity of abnormal movements (highest score from questions above): None, normal Incapacitation due to abnormal movements: None, normal Patient's awareness of abnormal movements (rate only patient's report): No Awareness, Dental Status Current problems with teeth and/or dentures?: No Does patient usually wear dentures?: No  CIWA:    COWS:     Musculoskeletal: Strength & Muscle Tone: within normal limits Gait & Station: normal Patient leans: N/A  Psychiatric Specialty Exam: Physical Exam  Constitutional: He is oriented to person, place, and time. He appears well-developed and well-nourished.  HENT:  Head: Normocephalic and atraumatic.  Eyes: Conjunctivae and EOM are normal.  Neck: Normal range of motion.  Respiratory: Effort normal.  Musculoskeletal: Normal range of motion.  Neurological: He is alert and oriented to person, place, and time.    Review of Systems  Constitutional: Negative.   HENT: Negative.   Eyes: Negative.   Respiratory: Negative.   Cardiovascular: Negative.   Gastrointestinal: Negative.   Genitourinary: Negative.   Musculoskeletal: Negative.   Skin: Negative.   Neurological: Negative.   Endo/Heme/Allergies: Negative.   Psychiatric/Behavioral: Negative for depression, hallucinations, memory loss, substance abuse and suicidal ideas. The  patient is not nervous/anxious and does not have insomnia.     Blood pressure 118/64, pulse (!) 54, temperature 97.8 F (36.6 C), temperature source Oral, resp. rate 18, height '5\' 9"'$  (1.753 m), weight 82.6 kg (182 lb), SpO2 99 %.Body mass index is 26.88 kg/m.  General Appearance: Well Groomed  Eye Contact:  Good  Speech:  Clear and Coherent  Volume:  Normal  Mood:  Euthymic  Affect:  Congruent  Thought Process:  Linear and Descriptions of Associations: Intact  Orientation:  Full (Time, Place, and Person)  Thought Content:  Hallucinations: None  Suicidal Thoughts:  No  Homicidal  Thoughts:  No  Memory:  Immediate;   Fair Recent;   Fair Remote;   Fair  Judgement:  Fair  Insight:  Fair  Psychomotor Activity:  Normal  Concentration:  Concentration: Good and Attention Span: Good  Recall:  Good  Fund of Knowledge:  Good  Language:  Good  Akathisia:  No  Handed:    AIMS (if indicated):     Assets:  Communication Skills Physical Health  ADL's:  Intact  Cognition:  WNL  Sleep:  Number of Hours: 6.3     Have you used any form of tobacco in the last 30 days? (Cigarettes, Smokeless Tobacco, Cigars, and/or Pipes): No  Has this patient used any form of tobacco in the last 30 days? (Cigarettes, Smokeless Tobacco, Cigars, and/or Pipes) Yes, Yes, A prescription for an FDA-approved tobacco cessation medication was offered at discharge and the patient refused  Blood Alcohol level:  Lab Results  Component Value Date   [facility] <5 03/03/2016   ETH <5 76/73/4193    Metabolic Disorder Labs:  Lab Results  Component Value Date   HGBA1C 5.5 03/08/2016   Lab Results  Component Value Date   PROLACTIN 28.4 (H) 03/08/2016   Lab Results  Component Value Date   CHOL 175 03/08/2016   TRIG 230 (H) 03/08/2016   HDL 36 (L) 03/08/2016   CHOLHDL 4.9 03/08/2016   VLDL 46 (H) 03/08/2016   LDLCALC 93 03/08/2016   Results for THEODORE, VIRGIN (MRN [id number]) as of 03/11/2016 13:28  Ref. Range 03/03/2016 20:17 03/03/2016 20:33 03/08/2016 07:00 03/08/2016 14:04 03/08/2016 14:04  Sodium Latest Ref Range: 135 - 145 mmol/L  138     Potassium Latest Ref Range: 3.5 - 5.1 mmol/L  3.8     Chloride Latest Ref Range: 101 - 111 mmol/L  105     CO2 Latest Ref Range: 22 - 32 mmol/L  24     BUN Latest Ref Range: 6 - 20 mg/dL  9     Creatinine Latest Ref Range: 0.61 - 1.24 mg/dL  0.65     Calcium Latest Ref Range: 8.9 - 10.3 mg/dL  9.5     EGFR (Non-African Amer.) Latest Ref Range: >60 mL/min  >60     EGFR (African American) Latest Ref Range: >60 mL/min  >60     Glucose Latest Ref Range: 65 - 99  mg/dL  105 (H)     Anion gap Latest Ref Range: 5 - 15   9     Alkaline Phosphatase Latest Ref Range: 38 - 126 U/L  73     Albumin Latest Ref Range: 3.5 - 5.0 g/dL  4.9     AST Latest Ref Range: 15 - 41 U/L  22     ALT Latest Ref Range: 17 - 63 U/L  27     Total Protein Latest Ref Range: 6.5 -  8.1 g/dL  8.2 (H)     Total Bilirubin Latest Ref Range: 0.3 - 1.2 mg/dL  1.1     Cholesterol Latest Ref Range: 0 - 200 mg/dL   175    Triglycerides Latest Ref Range: <150 mg/dL   230 (H)    HDL Cholesterol Latest Ref Range: >40 mg/dL   36 (L)    LDL (calc) Latest Ref Range: 0 - 99 mg/dL   93    VLDL Latest Ref Range: 0 - 40 mg/dL   46 (H)    Total CHOL/HDL Ratio Latest Units: RATIO   4.9    WBC Latest Ref Range: 4.0 - 10.5 K/uL  7.9     RBC Latest Ref Range: 4.22 - 5.81 MIL/uL  4.92     Hemoglobin Latest Ref Range: 13.0 - 17.0 g/dL  15.0     HCT Latest Ref Range: 39.0 - 52.0 %  43.4     MCV Latest Ref Range: 78.0 - 100.0 fL  88.2     MCH Latest Ref Range: 26.0 - 34.0 pg  30.5     MCHC Latest Ref Range: 30.0 - 36.0 g/dL  34.6     RDW Latest Ref Range: 11.5 - 15.5 %  12.0     Platelets Latest Ref Range: 150 - 400 K/uL  281     Acetaminophen (Tylenol), S Latest Ref Range: 10 - 30 ug/mL  <16 (L)     Salicylate Lvl Latest Ref Range: 2.8 - 30.0 mg/dL  <4.0     Prolactin Latest Ref Range: 4.0 - 15.2 ng/mL   28.4 (H)    Hemoglobin A1C Latest Ref Range: 4.0 - 6.0 %   5.5    TSH Latest Ref Range: 0.350 - 4.500 uIU/mL   4.089    Alcohol, Ethyl (B) Latest Ref Range: <5 mg/dL  <5     Amphetamines Latest Ref Range: NONE DETECTED  NONE DETECTED      Barbiturates Latest Ref Range: NONE DETECTED  NONE DETECTED      Benzodiazepines Latest Ref Range: NONE DETECTED  NONE DETECTED      Opiates Latest Ref Range: NONE DETECTED  NONE DETECTED      COCAINE Latest Ref Range: NONE DETECTED  NONE DETECTED      Tetrahydrocannabinol Latest Ref Range: NONE DETECTED  POSITIVE (A)      EKG 12-LEAD Unknown    Rpt Rpt    EKG Vent. rate 89 BPM PR interval 170 ms QRS duration 96 ms QT/QTc 336/408 ms P-R-T axes  See Psychiatric Specialty Exam and Suicide Risk Assessment completed by Attending Physician prior to discharge.  Discharge destination:  Home  Is patient on multiple antipsychotic therapies at discharge:  No   Has Patient had three or more failed trials of antipsychotic monotherapy by history:  No  Recommended Plan for Multiple Antipsychotic Therapies: NA     Medication List    TAKE these medications     Indication  divalproex 500 MG 24 hr tablet Commonly known as:  DEPAKOTE ER Take 3 tablets (1,500 mg total) by mouth at bedtime.  Indication:  mood, schizoaffective   LORazepam 1 MG tablet Commonly known as:  ATIVAN Take 1 tablet (1 mg total) by mouth at bedtime as needed for anxiety.  Indication:  insomnia   OLANZapine 20 MG tablet Commonly known as:  ZYPREXA Take 1 tablet (20 mg total) by mouth at bedtime.  Indication:  Schizophrenia      Follow-up Information  [facility]. Go on 03/18/2016.   Why:  Please arrive to your appointment to see Dr. [name] for medication management. It is important to take your discharge paperwork to this appointment to discuss medication adjustments and concerns. Arrive as early as possible for prompt services. Contact information: Address: [address], [place], [place] [address] Hours: 8AM-5PM Phone: [phone] Fax: [phone]           Signed: Hildred Priest, MD 03/12/2016, 9:57 AM

## 2016-03-12 LAB — VALPROIC ACID LEVEL: Valproic Acid Lvl: 90 ug/mL (ref 50.0–100.0)

## 2016-03-12 LAB — AMMONIA: Ammonia: 20 umol/L (ref 9–35)

## 2016-03-12 MED ORDER — DIVALPROEX SODIUM ER 500 MG PO TB24: 1500.0000 mg | ORAL_TABLET | Freq: Every day | ORAL | 0 refills | Status: DC

## 2016-03-12 NOTE — Progress Notes (Signed)
Denies SI/HI/AVH.  Pacing halls mumbling to self.  Intrusive. Discharge instructions given, verbalized understanding.  Prescriptions given and personal belongings returned.  Escorted off unit by staff to meet family to travel home.

## 2016-03-12 NOTE — [facility] Group Notes (Signed)
[facility] Group Notes:  (Nursing/MHT/Case Management/Adjunct)  Date:  03/12/2016  Time:  4:57 AM  Type of Therapy:  Psychoeducational Skills  Participation Level:  Active  Participation Quality:  Appropriate  Affect:  Appropriate  Cognitive:  Appropriate  Insight:  Appropriate and Good  Engagement in Group:  Engaged  Modes of Intervention:  Discussion, Socialization and Support  Summary of Progress/Problems:  Reece Agar 03/12/2016, 4:57 AM

## 2016-03-12 NOTE — Progress Notes (Signed)
Pt denies any SI/HI/AVH. Appears to be responding to internal stimuli at times. Pt seen talking to self and laughing in hallway. Visible in milieu with minimal interaction amongst peers. Preoccupied. Medication compliant. Denies pain and voices no additional concerns at this time. Safety maintained.

## 2016-03-12 NOTE — Progress Notes (Signed)
  [facility] Adult Case Management Discharge Plan :  Will you be returning to the same living situation after discharge:  Yes,  Patient will be returning back home to live with his parents.  At discharge, do you have transportation home?: Yes,  Patient will contact a family member to pick him up at discharge.  Do you have the ability to pay for your medications: Yes,  Patient has insurance through [place].  Release of information consent forms completed and in the chart;  Patient's signature needed at discharge.  Patient to Follow up at: Follow-up Information    [facility]. Go on 03/18/2016.   Why:  Please arrive to your appointment to see Dr. [name] for medication management. It is important to take your discharge paperwork to this appointment to discuss medication adjustments and concerns. Arrive as early as possible for prompt services. Contact information: Address: [address], [place], [place] [address] Hours: 8AM-5PM Phone: [phone] Fax: [phone]          Next level of care provider has access to [place] and Suicide Prevention discussed: Yes,  Mother, Oshua Deoliveira  Have you used any form of tobacco in the last 30 days? (Cigarettes, Smokeless Tobacco, Cigars, and/or Pipes): No  Has patient been referred to the Quitline?: N/A patient is not a smoker  Patient has been referred for addiction treatment: Yes  Khristie Sak G. [place], [place] 03/12/2016, 9:56 AM

## 2016-03-31 ENCOUNTER — Emergency Department (HOSPITAL_COMMUNITY)
Admission: EM | Admit: 2016-03-31 | Discharge: 2016-03-31 | Disposition: A | Discharge status: Home / Self Care | Payer: Medicaid Other | Attending: Emergency Medicine | Admitting: Emergency Medicine

## 2016-03-31 ENCOUNTER — Emergency Department (HOSPITAL_COMMUNITY): Payer: Medicaid Other

## 2016-03-31 ENCOUNTER — Encounter (HOSPITAL_COMMUNITY): Payer: Self-pay

## 2016-03-31 DIAGNOSIS — Z87891 Personal history of nicotine dependence: Secondary | ICD-10-CM | POA: Diagnosis not present

## 2016-03-31 DIAGNOSIS — L03119 Cellulitis of unspecified part of limb: Secondary | ICD-10-CM

## 2016-03-31 DIAGNOSIS — L03115 Cellulitis of right lower limb: Secondary | ICD-10-CM | POA: Diagnosis not present

## 2016-03-31 DIAGNOSIS — Z79899 Other long term (current) drug therapy: Secondary | ICD-10-CM | POA: Diagnosis not present

## 2016-03-31 DIAGNOSIS — M79671 Pain in right foot: Secondary | ICD-10-CM | POA: Diagnosis present

## 2016-03-31 MED ORDER — SULFAMETHOXAZOLE-TRIMETHOPRIM 800-160 MG PO TABS: 1.0000 | ORAL_TABLET | Freq: Two times a day (BID) | ORAL | 0 refills | Status: DC

## 2016-03-31 MED ORDER — CEPHALEXIN 500 MG PO CAPS: 500.0000 mg | ORAL_CAPSULE | Freq: Two times a day (BID) | ORAL | 0 refills | Status: DC

## 2016-03-31 MED ORDER — HYDROCODONE-ACETAMINOPHEN 5-325 MG PO TABS
1.0000 | ORAL_TABLET | Freq: Once | ORAL | Status: AC
Start: 2016-03-31 — End: 2016-03-31
  Administered 2016-03-31: 1 via ORAL
  Filled 2016-03-31: qty 1

## 2016-03-31 MED ORDER — SULFAMETHOXAZOLE-TRIMETHOPRIM 800-160 MG PO TABS
1.0000 | ORAL_TABLET | Freq: Once | ORAL | Status: AC
  Administered 2016-03-31: 1 via ORAL
  Filled 2016-03-31: qty 1

## 2016-03-31 MED ORDER — NAPROXEN 500 MG PO TABS
500.0000 mg | ORAL_TABLET | Freq: Once | ORAL | Status: AC
  Administered 2016-03-31: 500 mg via ORAL
  Filled 2016-03-31: qty 1

## 2016-03-31 NOTE — Discharge Instructions (Signed)
Likely you are having a foot cellulitis -take the antibiotics.  Keep the area clean and dry, apply bacitracin ointment daily and take the medications provided. RETURN TO THE ER IF THERE IS INCREASED PAIN, REDNESS, PUS COMING OUT from anywhere in the foot.  For the hand - see the Orthopedic doctor if needed. Ice the area of pain.

## 2016-03-31 NOTE — ED Triage Notes (Signed)
Pt c/o R foot swelling and pain. Also c/o of pain in L thumb and R pinky finger. PT recently started new medication. Pt is ambulatory, but unsteady on his feet. A&Ox4.

## 2016-03-31 NOTE — ED Provider Notes (Signed)
[facility] DEPT Provider Note   CSN: [id number] Arrival date & time: 03/31/16  0036  By signing my name below, I, Kevin Vance, attest that this documentation has been prepared under the direction and in the presence of Varney Biles, MD.  Electronically Signed: Julien Vance, ED Scribe. 03/31/16. 2:00 AM.    History   Chief Complaint Chief Complaint  Patient presents with  . Foot Pain    Right    The history is provided by the patient. No language interpreter was used.   HPI Comments: Kevin Vance is a 25 y.o. male who has a PMhx of bipolar 1 disorder and schizoaffective disorder presents to the Emergency Department complaining of sudden onset, gradual worsening right foot swelling with associated pain that started 2 weeks ago. There is erythema to his right 2nd toe and sensitive to touch. He also complains of intermittent left thumb pain. He states he is unable to apply pressure secondary to pain. Pt says that he uses his hands a lot whenever he plays video games. He has been taking ibuprofen and tylenol to alleviate his pain with no relief. Pt denies tingling or hx of fungal infections or athletes foot. He further denies IV drug use.  Past Medical History:  Diagnosis Date  . Bipolar 1 disorder ([place])   . Schizoaffective disorder [facility])     Patient Active Problem List   Diagnosis Date Noted  . Noncompliance 03/06/2016  . Schizoaffective disorder, bipolar type ([place]) 03/05/2016  . Tobacco use disorder 02/08/2010  . HEMANGIOMA OF SKIN AND SUBCUTANEOUS TISSUE 04/02/2008    Past Surgical History:  Procedure Laterality Date  . ROTATOR CUFF REPAIR         Home Medications    Prior to Admission medications   Medication Sig Start Date End Date Taking? Authorizing Provider  OLANZapine (ZYPREXA) 20 MG tablet Take 1 tablet (20 mg total) by mouth at bedtime. 03/11/16  Yes Hildred Priest, MD  Oxcarbazepine (TRILEPTAL) 300 MG tablet Take 300 mg by mouth at  bedtime.   Yes Historical Provider, MD  cephALEXin (KEFLEX) 500 MG capsule Take 1 capsule (500 mg total) by mouth 2 (two) times daily. 03/31/16   Varney Biles, MD  divalproex (DEPAKOTE ER) 500 MG 24 hr tablet Take 3 tablets (1,500 mg total) by mouth at bedtime. Patient not taking: Reported on 03/31/2016 03/12/16   Hildred Priest, MD  LORazepam (ATIVAN) 1 MG tablet Take 1 tablet (1 mg total) by mouth at bedtime as needed for anxiety. Patient not taking: Reported on 03/31/2016 03/11/16   Hildred Priest, MD  sulfamethoxazole-trimethoprim (BACTRIM DS,SEPTRA DS) 800-160 MG tablet Take 1 tablet by mouth 2 (two) times daily. 03/31/16 04/07/16  Varney Biles, MD    Family History History reviewed. No pertinent family history.  Social History Social History  Substance Use Topics  . Smoking status: Former Smoker    Quit date: 01/16/2016  . Smokeless tobacco: Never Used  . Alcohol use No     Allergies   Review of patient's allergies indicates no known allergies.   Review of Systems Review of Systems  A complete 10 system review of systems was obtained and all systems are negative except as noted in the HPI and PMH.   Physical Exam Updated Vital Signs BP 132/84   Pulse 88   Temp 98 F (36.7 C) (Oral)   Resp 20   SpO2 98%   Physical Exam  Constitutional: He is oriented to person, place, and time. He appears well-developed and well-nourished.  HENT:  Head: Normocephalic.  Eyes: EOM are normal.  Neck: Normal range of motion.  Pulmonary/Chest: Effort normal.  Abdominal: He exhibits no distension.  Musculoskeletal: Normal range of motion.  Right foot: pt has erythema that is extended from 2nd toe towards the proximal part of the foot. There a calor and dolor, ROM of ankle is normal No evidence of foul smelling discharge or significant drainage between the toes.  Neurological: He is alert and oriented to person, place, and time.  Psychiatric: He has a normal mood and  affect.  Nursing note and vitals reviewed.    ED Treatments / Results  DIAGNOSTIC STUDIES: Oxygen Saturation is 100% on RA, normal by my interpretation.  COORDINATION OF CARE:  1:56 AM Discussed treatment plan which includes antibiotics and pain medication with pt at bedside and pt agreed to plan. Pt was advised to follow up with an orthopedic specialist next week. If symptoms get worse with antibiotics, pt was advised to come back to the ED before his appointment.  Labs (all labs ordered are listed, but only abnormal results are displayed) Labs Reviewed - No data to display  EKG  EKG Interpretation None       Radiology No results found.  Procedures Procedures (including critical care time)  Medications Ordered in ED Medications  naproxen (NAPROSYN) tablet 500 mg (500 mg Oral Given 03/31/16 0222)  sulfamethoxazole-trimethoprim (BACTRIM DS,SEPTRA DS) 800-160 MG per tablet 1 tablet (1 tablet Oral Given 03/31/16 0222)  HYDROcodone-acetaminophen (NORCO/VICODIN) 5-325 MG per tablet 1 tablet (1 tablet Oral Given 03/31/16 0222)     Initial Impression / Assessment and Plan / ED Course  I have reviewed the triage vital signs and the nursing notes.  Pertinent labs & imaging results that were available during my care of the patient were reviewed by me and considered in my medical decision making (see chart for details).  Clinical Course    I personally performed the services described in this documentation, which was scribed in my presence. The recorded information has been reviewed and is accurate.  Pt with sudden onset foot pain that started while he was playing video game. No trauma. Pt is immunocompetent. His foot certainly is edematous and has callor and dolor with rubor. We will get xrays and will treat this as a cellulitis. Strict ER return precautions have been discussed, and patient is agreeing with the plan and is comfortable with the workup done and the recommendations  from the ER.   Final Clinical Impressions(s) / ED Diagnoses   Final diagnoses:  Cellulitis of foot    New Prescriptions Discharge Medication List as of 03/31/2016  3:33 AM    START taking these medications   Details  cephALEXin (KEFLEX) 500 MG capsule Take 1 capsule (500 mg total) by mouth 2 (two) times daily., Starting Thu 03/31/2016, Print    sulfamethoxazole-trimethoprim (BACTRIM DS,SEPTRA DS) 800-160 MG tablet Take 1 tablet by mouth 2 (two) times daily., Starting Thu 03/31/2016, Until Thu 04/07/2016, Print         Varney Biles, MD 04/02/16 1058

## 2016-03-31 NOTE — ED Notes (Signed)
Patient transported to X-ray 

## 2016-04-05 ENCOUNTER — Inpatient Hospital Stay (HOSPITAL_COMMUNITY)
Admission: EM | Admit: 2016-04-05 | Discharge: 2016-04-08 | DRG: 554 | Disposition: A | Discharge status: Home / Self Care | Payer: Medicaid Other | Attending: Internal Medicine | Admitting: Internal Medicine

## 2016-04-05 ENCOUNTER — Encounter (HOSPITAL_COMMUNITY): Payer: Self-pay

## 2016-04-05 DIAGNOSIS — R5383 Other fatigue: Secondary | ICD-10-CM

## 2016-04-05 DIAGNOSIS — R5381 Other malaise: Secondary | ICD-10-CM

## 2016-04-05 DIAGNOSIS — L03115 Cellulitis of right lower limb: Secondary | ICD-10-CM | POA: Diagnosis present

## 2016-04-05 DIAGNOSIS — F250 Schizoaffective disorder, bipolar type: Secondary | ICD-10-CM | POA: Diagnosis present

## 2016-04-05 DIAGNOSIS — M130 Polyarthritis, unspecified: Secondary | ICD-10-CM

## 2016-04-05 DIAGNOSIS — R079 Chest pain, unspecified: Secondary | ICD-10-CM

## 2016-04-05 DIAGNOSIS — Z79899 Other long term (current) drug therapy: Secondary | ICD-10-CM

## 2016-04-05 DIAGNOSIS — F17210 Nicotine dependence, cigarettes, uncomplicated: Secondary | ICD-10-CM | POA: Diagnosis present

## 2016-04-05 DIAGNOSIS — Z7951 Long term (current) use of inhaled steroids: Secondary | ICD-10-CM

## 2016-04-05 DIAGNOSIS — M1990 Unspecified osteoarthritis, unspecified site: Principal | ICD-10-CM | POA: Diagnosis present

## 2016-04-05 DIAGNOSIS — L0390 Cellulitis, unspecified: Secondary | ICD-10-CM | POA: Diagnosis present

## 2016-04-05 DIAGNOSIS — Z801 Family history of malignant neoplasm of trachea, bronchus and lung: Secondary | ICD-10-CM

## 2016-04-05 LAB — CBC
HCT: 41.4 % (ref 39.0–52.0)
Hemoglobin: 13.8 g/dL (ref 13.0–17.0)
MCH: 30.3 pg (ref 26.0–34.0)
MCHC: 33.3 g/dL (ref 30.0–36.0)
MCV: 91 fL (ref 78.0–100.0)
Platelets: 427 10*3/uL — ABNORMAL HIGH (ref 150–400)
RBC: 4.55 MIL/uL (ref 4.22–5.81)
RDW: 12.4 % (ref 11.5–15.5)
WBC: 9.6 10*3/uL (ref 4.0–10.5)

## 2016-04-05 LAB — BASIC METABOLIC PANEL
Anion gap: 9 (ref 5–15)
BUN: 14 mg/dL (ref 6–20)
CO2: 29 mmol/L (ref 22–32)
Calcium: 10 mg/dL (ref 8.9–10.3)
Chloride: 102 mmol/L (ref 101–111)
Creatinine, Ser: 0.94 mg/dL (ref 0.61–1.24)
GFR calc Af Amer: >60 mL/min (ref >60)
GFR calc non Af Amer: >60 mL/min (ref >60)
Glucose, Bld: 113 mg/dL — ABNORMAL HIGH (ref 65–99)
Potassium: 4.9 mmol/L (ref 3.5–5.1)
Sodium: 140 mmol/L (ref 135–145)

## 2016-04-05 MED ORDER — VANCOMYCIN HCL IN DEXTROSE 1-5 GM/200ML-% IV SOLN
1000.0000 mg | Freq: Once | INTRAVENOUS | Status: AC
  Administered 2016-04-06: 1000 mg via INTRAVENOUS
  Filled 2016-04-05: qty 200

## 2016-04-05 NOTE — ED Triage Notes (Signed)
Pt seen two weeks ago for the same, given antibiotics and there has been no change, now he has the same pain in his thumb and some fingers around the joints

## 2016-04-05 NOTE — ED Notes (Signed)
Bed: WA06 Expected date:  Expected time:  Means of arrival:  Comments: Minor care 27

## 2016-04-05 NOTE — Progress Notes (Addendum)
Right foot swelling and now left hand swelling and painful to touch., pt denies any injury and stated the antiboitics he was on have not helped him. Positive edema in both extremities as well. Pt started to develop left knee pain today too and has some low back pain. Pt seen by MD and will be admitted. Phoned cahrge nurse Terri to make her aware. Pt will be moved to the main ER.

## 2016-04-05 NOTE — ED Provider Notes (Signed)
DEPT Provider Note   CSN: [id number] Arrival date & time: 04/05/16  2117  History   Chief Complaint Chief Complaint  Patient presents with  . Foot Pain    HPI Kevin Vance is a 25 y.o. male.  HPI  25 y.o. male with a hx of Bipolar Disorder, Schizoaffective Disorder, presents to the Emergency Department today complaining of unchanged symptoms of right foot swelling and redness since previous ED visit on 03-31-16. Diagnosed with cellulitis and given Bactrim and Keflex. Pt took as prescribed with no change in symptoms. Swelling never went down despite elevation and OTC remedies. Pt notes he has had symptoms for over 1 month now and has been unchanged. Not intermittent left thumb pain as well that he was seen in ED for with unchanged symptoms. Notes worsening with playing video games. No hx IV drug use. No N/V. No fevers. No other symptoms noted.   Past Medical History:  Diagnosis Date  . Bipolar 1 disorder ([place])   . Schizoaffective disorder [facility])     Patient Active Problem List   Diagnosis Date Noted  . Noncompliance 03/06/2016  . Schizoaffective disorder, bipolar type ([place]) 03/05/2016  . Tobacco use disorder 02/08/2010  . HEMANGIOMA OF SKIN AND SUBCUTANEOUS TISSUE 04/02/2008    Past Surgical History:  Procedure Laterality Date  . ROTATOR CUFF REPAIR       Home Medications    Prior to Admission medications   Medication Sig Start Date End Date Taking? Authorizing Provider  cephALEXin (KEFLEX) 500 MG capsule Take 1 capsule (500 mg total) by mouth 2 (two) times daily. 03/31/16   Varney Biles, MD  divalproex (DEPAKOTE ER) 500 MG 24 hr tablet Take 3 tablets (1,500 mg total) by mouth at bedtime. Patient not taking: Reported on 03/31/2016 03/12/16   Hildred Priest, MD  LORazepam (ATIVAN) 1 MG tablet Take 1 tablet (1 mg total) by mouth at bedtime as needed for anxiety. Patient not taking: Reported on 03/31/2016 03/11/16   Hildred Priest, MD    OLANZapine (ZYPREXA) 20 MG tablet Take 1 tablet (20 mg total) by mouth at bedtime. 03/11/16   Hildred Priest, MD  Oxcarbazepine (TRILEPTAL) 300 MG tablet Take 300 mg by mouth at bedtime.    Historical Provider, MD  sulfamethoxazole-trimethoprim (BACTRIM DS,SEPTRA DS) 800-160 MG tablet Take 1 tablet by mouth 2 (two) times daily. 03/31/16 04/07/16  Varney Biles, MD    Family History History reviewed. No pertinent family history.  Social History Social History  Substance Use Topics  . Smoking status: Former Smoker    Quit date: 01/16/2016  . Smokeless tobacco: Never Used  . Alcohol use No     Allergies   Review of patient's allergies indicates no known allergies.   Review of Systems Review of Systems  Constitutional: Negative for fever.  Gastrointestinal: Negative for nausea and vomiting.  Musculoskeletal: Positive for arthralgias and joint swelling.  Skin: Positive for rash.  Allergic/Immunologic: Negative for immunocompromised state.  ROS reviewed and all are negative for acute change except as noted in the HPI.  Physical Exam Updated Vital Signs BP 118/75 (BP Location: Left Arm)   Pulse 86   Temp 98.1 F (36.7 C) (Oral)   Resp 18   SpO2 96%   Physical Exam  Constitutional: He is oriented to person, place, and time. Vital signs are normal. He appears well-developed and well-nourished.  HENT:  Head: Normocephalic.  Right Ear: Hearing normal.  Left Ear: Hearing normal.  Eyes: Conjunctivae and EOM are normal. Pupils  are equal, round, and reactive to light.  Cardiovascular: Normal rate, regular rhythm, normal heart sounds and intact distal pulses.   Pulmonary/Chest: Effort normal and breath sounds normal.  Musculoskeletal:  Right Foot: Erythema that is extended from 2nd toe towards the proximal part of the foot. Circumferential swelling with extension into foot. Non pitting. NVI. ROM intact. Motor/sensation intact.   Neurological: He is alert and oriented to  person, place, and time.  Skin: Skin is warm and dry.  Psychiatric: He has a normal mood and affect. His speech is normal and behavior is normal. Thought content normal.  Nursing note and vitals reviewed.  ED Treatments / Results  Labs (all labs ordered are listed, but only abnormal results are displayed) Labs Reviewed  CBC - Abnormal; Notable for the following:       Result Value   Platelets 427 (*)    All other components within normal limits  BASIC METABOLIC PANEL - Abnormal; Notable for the following:    Glucose, Bld 113 (*)    All other components within normal limits   EKG  EKG Interpretation None      Radiology No results found.  Procedures Procedures (including critical care time)  Medications Ordered in ED Medications  vancomycin (VANCOCIN) IVPB 1000 mg/200 mL premix (1,000 mg Intravenous New Bag/Given 04/06/16 0010)    Initial Impression / Assessment and Plan / ED Course  I have reviewed the triage vital signs and the nursing notes.  Pertinent labs & imaging results that were available during my care of the patient were reviewed by me and considered in my medical decision making (see chart for details).  Clinical Course    Final Clinical Impressions(s) / ED Diagnoses  I have reviewed and evaluated the relevant laboratory values I have reviewed the relevant previous healthcare records. I obtained HPI from historian. Patient discussed with supervising physician  ED Course:  Assessment: Pt is a 25yM who presents with right foot swelling x 1 month. Seen in ED on 03-31-16 and Dx cellulitis. Bactrim and Keflex without relief of swelling. Noted mild improvement of erythema, but has returned. On exam, pt in NAD. Nontoxic/nonseptic appearing. VSS. Afebrile. Circumferential swelling noted on right foot with worsening swelling on 2nd toe. Erythema noted. No purulence noted. No red streaking. CBC/BMP unremarkable. Given Vancomycin in ED for worsening cellulitis despite  treatment. Seen by supervising physician who agrees with plan. Low suspicion for gout. Plan is to Admit to medicine due to failed outpatient therapy. Expect inpatient treatment wit IV antibiotics.    Disposition/Plan:  Admit Pt acknowledges and agrees with plan  Supervising Physician Nat Christen, MD   Final diagnoses:  Cellulitis of right lower extremity    New Prescriptions New Prescriptions   No medications on file     Shary Decamp, PA-C 04/06/16 0025    Nat Christen, MD 04/06/16 [phone]

## 2016-04-06 ENCOUNTER — Inpatient Hospital Stay (HOSPITAL_COMMUNITY): Payer: Medicaid Other

## 2016-04-06 DIAGNOSIS — L03031 Cellulitis of right toe: Secondary | ICD-10-CM | POA: Diagnosis not present

## 2016-04-06 DIAGNOSIS — F250 Schizoaffective disorder, bipolar type: Secondary | ICD-10-CM

## 2016-04-06 DIAGNOSIS — R5383 Other fatigue: Secondary | ICD-10-CM

## 2016-04-06 DIAGNOSIS — Z79899 Other long term (current) drug therapy: Secondary | ICD-10-CM | POA: Diagnosis not present

## 2016-04-06 DIAGNOSIS — T732XXA Exhaustion due to exposure, initial encounter: Secondary | ICD-10-CM

## 2016-04-06 DIAGNOSIS — F17210 Nicotine dependence, cigarettes, uncomplicated: Secondary | ICD-10-CM | POA: Diagnosis present

## 2016-04-06 DIAGNOSIS — M2550 Pain in unspecified joint: Secondary | ICD-10-CM | POA: Diagnosis present

## 2016-04-06 DIAGNOSIS — Z801 Family history of malignant neoplasm of trachea, bronchus and lung: Secondary | ICD-10-CM | POA: Diagnosis not present

## 2016-04-06 DIAGNOSIS — L03115 Cellulitis of right lower limb: Secondary | ICD-10-CM | POA: Diagnosis present

## 2016-04-06 DIAGNOSIS — Z7951 Long term (current) use of inhaled steroids: Secondary | ICD-10-CM | POA: Diagnosis not present

## 2016-04-06 DIAGNOSIS — M130 Polyarthritis, unspecified: Secondary | ICD-10-CM | POA: Diagnosis not present

## 2016-04-06 DIAGNOSIS — L0390 Cellulitis, unspecified: Secondary | ICD-10-CM | POA: Diagnosis present

## 2016-04-06 DIAGNOSIS — R5381 Other malaise: Secondary | ICD-10-CM | POA: Diagnosis not present

## 2016-04-06 DIAGNOSIS — M1990 Unspecified osteoarthritis, unspecified site: Secondary | ICD-10-CM | POA: Diagnosis present

## 2016-04-06 LAB — URINALYSIS, ROUTINE W REFLEX MICROSCOPIC
BILIRUBIN URINE: NEGATIVE
GLUCOSE, UA: NEGATIVE mg/dL
Hgb urine dipstick: NEGATIVE
KETONES UR: NEGATIVE mg/dL
Leukocytes, UA: NEGATIVE
Nitrite: NEGATIVE
PH: 5.5 (ref 5.0–8.0)
PROTEIN: NEGATIVE mg/dL
Specific Gravity, Urine: 1.03 (ref 1.005–1.030)

## 2016-04-06 LAB — RAPID URINE DRUG SCREEN, HOSP PERFORMED
AMPHETAMINES: NOT DETECTED
BARBITURATES: NOT DETECTED
Benzodiazepines: NOT DETECTED
Cocaine: NOT DETECTED
Opiates: NOT DETECTED
TETRAHYDROCANNABINOL: POSITIVE — AB

## 2016-04-06 LAB — HEPATIC FUNCTION PANEL
ALK PHOS: 92 U/L (ref 38–126)
ALT: 52 U/L (ref 17–63)
AST: 34 U/L (ref 15–41)
Albumin: 3.8 g/dL (ref 3.5–5.0)
BILIRUBIN TOTAL: 0.6 mg/dL (ref 0.3–1.2)
Total Protein: 7.7 g/dL (ref 6.5–8.1)

## 2016-04-06 LAB — HIV ANTIBODY (ROUTINE TESTING W REFLEX): HIV Screen 4th Generation wRfx: NONREACTIVE

## 2016-04-06 LAB — TSH: TSH: 3.594 u[IU]/mL (ref 0.350–4.500)

## 2016-04-06 LAB — URIC ACID: Uric Acid, Serum: 5.7 mg/dL (ref 4.4–7.6)

## 2016-04-06 LAB — SEDIMENTATION RATE: SED RATE: 40 mm/h — AB (ref 0–16)

## 2016-04-06 LAB — C-REACTIVE PROTEIN: CRP: 4 mg/dL — ABNORMAL HIGH (ref <1.0)

## 2016-04-06 MED ORDER — ACETAMINOPHEN 650 MG RE SUPP: 650.0000 mg | Freq: Four times a day (QID) | RECTAL | Status: DC | PRN

## 2016-04-06 MED ORDER — DOXYCYCLINE HYCLATE 100 MG IV SOLR
100.0000 mg | Freq: Two times a day (BID) | INTRAVENOUS | Status: DC
  Administered 2016-04-06 – 2016-04-07 (×5): 100 mg via INTRAVENOUS
  Filled 2016-04-06 (×6): qty 100

## 2016-04-06 MED ORDER — SODIUM CHLORIDE 0.9% FLUSH
3.0000 mL | Freq: Two times a day (BID) | INTRAVENOUS | Status: DC
  Administered 2016-04-07 – 2016-04-08 (×3): 3 mL via INTRAVENOUS

## 2016-04-06 MED ORDER — OLANZAPINE 10 MG PO TABS
20.0000 mg | ORAL_TABLET | Freq: Every day | ORAL | Status: DC
  Administered 2016-04-06 – 2016-04-07 (×3): 20 mg via ORAL
  Filled 2016-04-06 (×4): qty 2

## 2016-04-06 MED ORDER — SODIUM CHLORIDE 0.9 % IV SOLN
250.0000 mL | INTRAVENOUS | Status: DC | PRN
  Administered 2016-04-06: 250 mL via INTRAVENOUS

## 2016-04-06 MED ORDER — TRAMADOL HCL 50 MG PO TABS
50.0000 mg | ORAL_TABLET | Freq: Four times a day (QID) | ORAL | Status: DC | PRN
Start: 2016-04-06 — End: 2016-04-08
  Administered 2016-04-07 – 2016-04-08 (×3): 50 mg via ORAL
  Filled 2016-04-06 (×3): qty 1

## 2016-04-06 MED ORDER — KETOROLAC TROMETHAMINE 15 MG/ML IJ SOLN
15.0000 mg | Freq: Four times a day (QID) | INTRAMUSCULAR | Status: AC | PRN
  Administered 2016-04-06: 15 mg via INTRAVENOUS
  Filled 2016-04-06: qty 1

## 2016-04-06 MED ORDER — ACETAMINOPHEN 325 MG PO TABS
650.0000 mg | ORAL_TABLET | Freq: Four times a day (QID) | ORAL | Status: DC | PRN
  Administered 2016-04-07 (×2): 650 mg via ORAL
  Filled 2016-04-06 (×2): qty 2

## 2016-04-06 MED ORDER — FLUTICASONE PROPIONATE 50 MCG/ACT NA SUSP
1.0000 | Freq: Every day | NASAL | Status: DC
Start: 2016-04-06 — End: 2016-04-08
  Filled 2016-04-06: qty 16

## 2016-04-06 MED ORDER — OXCARBAZEPINE 300 MG PO TABS
300.0000 mg | ORAL_TABLET | Freq: Every day | ORAL | Status: DC
  Administered 2016-04-06 – 2016-04-07 (×3): 300 mg via ORAL
  Filled 2016-04-06 (×4): qty 1

## 2016-04-06 MED ORDER — ONDANSETRON HCL 4 MG/2ML IJ SOLN: 4.0000 mg | Freq: Four times a day (QID) | INTRAMUSCULAR | Status: DC | PRN

## 2016-04-06 MED ORDER — SODIUM CHLORIDE 0.9% FLUSH: 3.0000 mL | INTRAVENOUS | Status: DC | PRN

## 2016-04-06 MED ORDER — MELATONIN 3 MG PO TABS
3.0000 mg | ORAL_TABLET | Freq: Every evening | ORAL | Status: DC | PRN
Start: 2016-04-06 — End: 2016-04-06

## 2016-04-06 MED ORDER — ONDANSETRON HCL 4 MG PO TABS: 4.0000 mg | ORAL_TABLET | Freq: Four times a day (QID) | ORAL | Status: DC | PRN

## 2016-04-06 NOTE — Progress Notes (Signed)
Patient admitted after midnight, please see H&P.  Multiple labs pending at this time.   Patient c/o chills, will add blood cultures.  No fever.  Eulogio Bear DO

## 2016-04-06 NOTE — H&P (Signed)
History and Physical    Kevin Vance DOB: 1991/05/24 DOA: 04/05/2016  PCP: No primary care provider on file.   Patient coming from: Home  Chief Complaint: Pain in multiple joints, increased fatigue/debility  HPI: Kevin Vance is a 25 y.o. gentleman with a history of Bipolar/schizoaffective disorder who feels that he was in his baseline state of health until approximately six weeks at that go.  At that time, he had self-limited pain and swelling in his right foot that resolved spontaneously after 2-3 days.  He had recurrent pain in the right foot just over one week ago, associated with swelling isolated to the second toe with mild erythema.  He was evaluated in the ED and diagnosed with a cellulitis.  Xray of the right foot did not show any concerning findings.  He was given prescriptions for Bactrim and Keflex, which he has been taking as prescribed.  He has not seen significant improvement.  However, he now complains of pain and swelling in multiple joints including both great toes, his right hand, his left thumb, and his left knee.  The area previously identified in his right foot is unchanged.  No progressive ulcerations or skin breakdown.  No drainage.  No significant warmth.  No fluctuance.  He has not had fever or night sweats but he reports generalized weakness and debility to the point that he cannot complete routine ADLs without difficulty.  He reports that he stays in bed most of the time now due to his pain.  He had an isolated episode of chest pain; it was not pleuritic in nature.  No significant chest pain.  No urinary retention; he had an isolated episode of incontinence.  He denies frequency.  No vision disturbance.  No syncope.  No rash.  He mentions that he pulled a tick off of his body approximately two months ago.   ED Course: The patient was diagnosed as cellulitis, failed outpatient therapy.  He received a dose of IV vancomycin.  Hospitalist asked to  admit.  Review of Systems: As per HPI otherwise 10 point review of systems negative.    Past Medical History:  Diagnosis Date  . Bipolar 1 disorder ([place])   . Schizoaffective disorder [facility])     Past Surgical History:  Procedure Laterality Date  . ROTATOR CUFF REPAIR       reports that he quit smoking about 2 months ago. He has never used smokeless tobacco. He reports that he does not drink alcohol or use drugs.  He admits that he "VAPES" and will still smoke cigarettes occasionally.  He admits to marijuana use but denies other illegal drug use.  He is not married; he does not have children.  No Known Allergies  FAMILY HISTORY: No known family history of rheumatologic disorders.  Multiple family members on his mother's side had lung cancer (smokers and nonsmokers).  Prior to Admission medications   Medication Sig Start Date End Date Taking? Authorizing Provider  fluticasone (FLONASE) 50 MCG/ACT nasal spray Place 1 spray into both nostrils daily.   Yes Historical Provider, MD  ibuprofen (ADVIL,MOTRIN) 200 MG tablet Take 400 mg by mouth every 6 (six) hours as needed for moderate pain.   Yes Historical Provider, MD  MELATONIN PO Take 1 tablet by mouth daily.   Yes Historical Provider, MD  OLANZapine (ZYPREXA) 20 MG tablet Take 1 tablet (20 mg total) by mouth at bedtime. 03/11/16  Yes Hildred Priest, MD  Oxcarbazepine (TRILEPTAL) 300 MG tablet Take 300 mg  by mouth at bedtime.   Yes Historical Provider, MD    Physical Exam: Vitals:   04/05/16 2145 04/05/16 2300  BP: 118/75   Pulse: 86   Resp: 18   Temp: 98.1 F (36.7 C)   TempSrc: Oral   SpO2: 96%   Weight:  83.9 kg (185 lb)      Constitutional: NAD, calm, comfortable, NONtoxic appearing Vitals:   04/05/16 2145 04/05/16 2300  BP: 118/75   Pulse: 86   Resp: 18   Temp: 98.1 F (36.7 C)   TempSrc: Oral   SpO2: 96%   Weight:  83.9 kg (185 lb)   Eyes: PERRL, lids and conjunctivae normal, no nystagmus ENMT:  Mucous membranes are moist. Posterior pharynx clear of any exudate or lesions. Normal dentition.  Neck: normal appearance, supple Respiratory: clear to auscultation bilaterally, no wheezing, no crackles. Normal respiratory effort. No accessory muscle use.  Cardiovascular: Normal rate, regular rhythm, no murmurs / rubs / gallops. No extremity edema. 2+ pedal pulses. GI: abdomen is soft and compressible.  No distention.  No tenderness.  No masses palpated.  Bowel sounds are present. Musculoskeletal:  Good ROM, no contractures. Normal muscle tone. Pain in multiple joints but no significant deformities at this point except for the swelling at the second toe on his right foot. Skin: no rashes, Pale, cool.  There is mild erythema on the dorsum of his right foot.  Mild TTP.  No fluctuance.  No ulceration or skin breakdown.  No drainage. Neurologic: CN 2-12 grossly intact. Sensation intact, Strength symmetric bilaterally, 5/5  Psychiatric: Normal judgment and insight. Alert and oriented x 3. Normal mood.     Labs on Admission: I have personally reviewed following labs and imaging studies  CBC:  Recent Labs Lab 04/05/16 2300  WBC 9.6  HGB 13.8  HCT 41.4  MCV 91.0  PLT [id number]*   Basic Metabolic Panel:  Recent Labs Lab 04/05/16 2300  NA 140  K 4.9  CL 102  CO2 29  GLUCOSE 113*  BUN 14  CREATININE 0.94  CALCIUM 10.0   GFR: Estimated Creatinine Clearance: 119.1 mL/min (by C-G formula based on SCr of 0.94 mg/dL).   Radiological Exams on Admission: Right foot xray on 9/14 was negative for osseous abnormality, soft tissue gas, or foreign body.  EKG: Independently reviewed. Last EKG 8/22 showed NSR, no QTc prolongation.  Repeat EKG to confirm QT interval pending.  Assessment/Plan Principal Problem:   Polyarticular arthritis Active Problems:   Schizoaffective disorder, bipolar type ([facility])   Cellulitis   Physical deconditioning   Fatigue      Subacute polyarticular arthritis.  I  am not completely convinced that the patient has a significant cellulitis at this point.  He has multiple joint complaints with systemic symptoms, which makes me more concerned about a systemic process such as a rheumatologic or autoimmune disorder.  He also reports a history of tick exposure. --He has been on oral Bactrim and Keflex for five days with no improvement in his symptoms.  Will stop these medications for now.  Empiric vancomycin given in the ED x 1.  Will switch to IV doxycycline for now (based on reported tick exposure) as his only antibiotic for now and monitor. --Defer any additional imaging for now. --Serologies to include: Hepatic function panel, sed rate, CRP, ANA, RF, anti CCP antibody, uric acid level, antistreptolysin O antibody, Borrelia antibodies, parvovirus antibodies, acute hepatitis panel, and HIV. --Check U/A, chest xray --Repeat EKG to confirm normal QT  interval --Blood cultures for fever greater than 101 --Analgesics as needed --He may need PT consult as well  History of Bipolar/schizoaffective disorder --Continue home doses of Zyprexa and Trileptal    DVT prophylaxis: SCDs Code Status: FULL Family Communication: Patient's mother present in the ED at time of admission. Disposition Plan: To be determined. Consults called: NONE Admission status: Inpatient, med surg  This patient was made inpatient based on the initial concern from the ED that he has a cellulitis that has failed oral outpatient therapy.  Thus, it was expected that the patient would require a least 48 hours of IV antibiotic therapy, with repeat assessments of the affected areas to determine an adequate response to therapy before returning to oral antibiotics.     TIME SPENT: 70 minutes   Eber Jones MD [facility] Pager [phone]  If 7PM-7AM, please contact night-coverage [email or web address] Password [email or web address]  04/06/2016, 1:35 AM

## 2016-04-07 LAB — HEPATITIS PANEL, ACUTE
HEP A IGM: NEGATIVE
HEP B S AG: NEGATIVE
Hep B C IgM: NEGATIVE

## 2016-04-07 LAB — RHEUMATOID FACTOR: Rhuematoid fact SerPl-aCnc: <10 IU/mL (ref 0.0–13.9)

## 2016-04-07 LAB — ANTISTREPTOLYSIN O TITER: ASO: 22 IU/mL (ref 0.0–200.0)

## 2016-04-07 MED ORDER — ENOXAPARIN SODIUM 40 MG/0.4ML ~~LOC~~ SOLN
40.0000 mg | SUBCUTANEOUS | Status: DC
  Administered 2016-04-07: 40 mg via SUBCUTANEOUS
  Filled 2016-04-07: qty 0.4

## 2016-04-07 NOTE — Progress Notes (Signed)
Pt complaining of pain in right foot 2nd toe. Pt has visible swelling with redness streaking up the foot. Pt complaining that swelling has gotten worse throughout the day.  Zanovia Rotz W Karol Liendo, RN

## 2016-04-07 NOTE — Progress Notes (Signed)
PROGRESS NOTE    Kevin Vance  [id number] DOB: 1990/09/19 DOA: 04/05/2016 PCP: No primary care provider on file.   [facility]     Brief Narrative:  Kevin Vance is a 25 y.o. gentleman with a history of Bipolar/schizoaffective disorder who feels that he was in his baseline state of health until approximately six weeks at that go.  At that time, he had self-limited pain and swelling in his right foot that resolved spontaneously after 2-3 days.  He had recurrent pain in the right foot just over one week ago, associated with swelling isolated to the second toe with mild erythema.  He was evaluated in the ED and diagnosed with a cellulitis.  Xray of the right foot did not show any concerning findings.  He was given prescriptions for Bactrim and Keflex, which he has been taking as prescribed.  He has not seen significant improvement.  However, he now complains of pain and swelling in multiple joints including both great toes, his right hand, his left thumb, and his left knee.  The area previously identified in his right foot is unchanged.  No progressive ulcerations or skin breakdown.  No drainage.  No significant warmth.  No fluctuance. He has not had fever or night sweats but he reports generalized weakness and debility to the point that he cannot complete routine ADLs without difficulty.  He reports that he stays in bed most of the time now due to his pain.  He had an isolated episode of chest pain; it was not pleuritic in nature.  No significant chest pain.  No urinary retention; he had an isolated episode of incontinence.  He denies frequency.  No vision disturbance.  No syncope.  No rash.  He mentions that he pulled a tick off of his body approximately two months ago. Most labs pending  Assessment & Plan:   Principal Problem:   Polyarticular arthritis Active Problems:   Schizoaffective disorder, bipolar type (HCC)   Cellulitis   Physical deconditioning    Fatigue   Subacute polyarticular arthritis.  I am not completely convinced that the patient has a significant cellulitis at this point.  He has multiple joint complaints with systemic symptoms, which makes me more concerned about a systemic process such as a rheumatologic or autoimmune disorder.  He also reports a history of tick exposure. --He has been on oral Bactrim and Keflex for five days with no improvement in his symptoms.  Will stop these medications.  Empiric vancomycin given in the ED x 1.  Switch to IV doxycycline for now (based on reported tick exposure) as his only antibiotic for now and monitor. Hepatic function panel ok, sed rate elevated, CRP elevated, uric acid level normal, HIV negative Pending: ANA, RF, anti CCP antibody,  antistreptolysin O antibody, Borrelia antibodies, parvovirus antibodies, acute hepatitis panel, and HIV. --U/A, chest xray ok --Blood cultures  --Analgesics as needed -negative GC probe  History of Bipolar/schizoaffective disorder --Continue home doses of Zyprexa and Trileptal -recently on depakote (can cause arthralgias)   DVT prophylaxis:  lovenox  Code Status: Full Code   Family Communication: patient  Disposition Plan:     Consultants:      Subjective: Still c/o pain in joints and foot  Objective: Vitals:   04/06/16 0540 04/06/16 1308 04/06/16 2047 04/07/16 0546  BP: (!) 102/55 125/68 118/65 118/68  Pulse: 62 99 79 70  Resp: 16 16 17 17   Temp: 97.8 F (36.6 C) 98 F (36.7 C) 99.1 F (37.3  C) 98 F (36.7 C)  TempSrc: Oral Oral Oral Oral  SpO2: 98% 97% 97% 98%  Weight:      Height:        Intake/Output Summary (Last 24 hours) at 04/07/16 1259 Last data filed at 04/07/16 0900  Gross per 24 hour  Intake           652.33 ml  Output              700 ml  Net           -47.67 ml   Filed Weights   04/05/16 2300 04/06/16 0156  Weight: 83.9 kg (185 lb) 83.9 kg (185 lb)    Examination:  General exam: Appears calm and  comfortable  Central nervous system: Alert and oriented. No focal neurological deficits. Skin: fingers appear swollen, redness around joint on 2nd toe on right foot Psychiatry: flat affect    Data Reviewed: I have personally reviewed following labs and imaging studies  CBC:  Recent Labs Lab 04/05/16 2300  WBC 9.6  HGB 13.8  HCT 41.4  MCV 91.0  PLT [id number]*   Basic Metabolic Panel:  Recent Labs Lab 04/05/16 2300  NA 140  K 4.9  CL 102  CO2 29  GLUCOSE 113*  BUN 14  CREATININE 0.94  CALCIUM 10.0   GFR: Estimated Creatinine Clearance: 125.7 mL/min (by C-G formula based on SCr of 0.94 mg/dL). Liver Function Tests:  Recent Labs Lab 04/06/16 0243  AST 34  ALT 52  ALKPHOS 92  BILITOT 0.6  PROT 7.7  ALBUMIN 3.8   No results for input(s): LIPASE, AMYLASE in the last 168 hours. No results for input(s): AMMONIA in the last 168 hours. Coagulation Profile: No results for input(s): INR, PROTIME in the last 168 hours. Cardiac Enzymes: No results for input(s): CKTOTAL, CKMB, CKMBINDEX, TROPONINI in the last 168 hours. BNP (last 3 results) No results for input(s): PROBNP in the last 8760 hours. HbA1C: No results for input(s): HGBA1C in the last 72 hours. CBG: No results for input(s): GLUCAP in the last 168 hours. Lipid Profile: No results for input(s): CHOL, HDL, LDLCALC, TRIG, CHOLHDL, LDLDIRECT in the last 72 hours. Thyroid Function Tests:  Recent Labs  04/06/16 0243  TSH 3.594   Anemia Panel: No results for input(s): VITAMINB12, FOLATE, FERRITIN, TIBC, IRON, RETICCTPCT in the last 72 hours. Urine analysis:    Component Value Date/Time   COLORURINE AMBER (A) 04/06/2016 1051   APPEARANCEUR CLEAR 04/06/2016 1051   LABSPEC 1.030 04/06/2016 1051   PHURINE 5.5 04/06/2016 1051   GLUCOSEU NEGATIVE 04/06/2016 1051   HGBUR NEGATIVE 04/06/2016 1051   HGBUR negative 02/08/2010 0954   BILIRUBINUR NEGATIVE 04/06/2016 1051   KETONESUR NEGATIVE 04/06/2016 1051    PROTEINUR NEGATIVE 04/06/2016 1051   UROBILINOGEN 0.2 02/08/2010 0954   NITRITE NEGATIVE 04/06/2016 1051   LEUKOCYTESUR NEGATIVE 04/06/2016 1051     )No results found for this or any previous visit (from the past 240 hour(s)).    Anti-infectives    Start     Dose/Rate Route Frequency Ordered Stop   04/06/16 0215  doxycycline (VIBRAMYCIN) 100 mg in dextrose 5 % 250 mL IVPB     100 mg 125 mL/hr over 120 Minutes Intravenous 2 times daily 04/06/16 0205     04/05/16 2315  vancomycin (VANCOCIN) IVPB 1000 mg/200 mL premix     1,000 mg 200 mL/hr over 60 Minutes Intravenous  Once 04/05/16 2304 04/06/16 0114  Radiology Studies: Dg Chest 2 View  Result Date: 04/06/2016 CLINICAL DATA:  Chest pain. EXAM: CHEST  2 VIEW COMPARISON:  None. FINDINGS: The heart size and mediastinal contours are within normal limits. Both lungs are clear. No pneumothorax or pleural effusion is noted. The visualized skeletal structures are unremarkable. IMPRESSION: No active cardiopulmonary disease. Electronically Signed   By: Marijo Conception, M.D.   On: 04/06/2016 08:40        Scheduled Meds: . doxycycline (VIBRAMYCIN) IV  100 mg Intravenous BID  . fluticasone  1 spray Each Nare Daily  . OLANZapine  20 mg Oral QHS  . Oxcarbazepine  300 mg Oral QHS  . sodium chloride flush  3 mL Intravenous Q12H   Continuous Infusions:    LOS: 1 day    Time spent: 35 min   [place], DO [facility] Pager [phone]  If 7PM-7AM, please contact night-coverage [email or web address] Password [facility] 04/07/2016, 12:59 PM

## 2016-04-08 DIAGNOSIS — M130 Polyarthritis, unspecified: Secondary | ICD-10-CM

## 2016-04-08 LAB — BASIC METABOLIC PANEL
ANION GAP: 8 (ref 5–15)
BUN: 12 mg/dL (ref 6–20)
CALCIUM: 9 mg/dL (ref 8.9–10.3)
CO2: 27 mmol/L (ref 22–32)
CREATININE: 0.7 mg/dL (ref 0.61–1.24)
Chloride: 103 mmol/L (ref 101–111)
GFR calc Af Amer: >60 mL/min (ref >60)
GLUCOSE: 103 mg/dL — AB (ref 65–99)
Potassium: 4 mmol/L (ref 3.5–5.1)
Sodium: 138 mmol/L (ref 135–145)

## 2016-04-08 LAB — CBC
HCT: 34.5 % — ABNORMAL LOW (ref 39.0–52.0)
Hemoglobin: 11.8 g/dL — ABNORMAL LOW (ref 13.0–17.0)
MCH: 29.9 pg (ref 26.0–34.0)
MCHC: 34.2 g/dL (ref 30.0–36.0)
MCV: 87.3 fL (ref 78.0–100.0)
PLATELETS: 308 10*3/uL (ref 150–400)
RBC: 3.95 MIL/uL — ABNORMAL LOW (ref 4.22–5.81)
RDW: 11.9 % (ref 11.5–15.5)
WBC: 7 10*3/uL (ref 4.0–10.5)

## 2016-04-08 MED ORDER — DOXYCYCLINE HYCLATE 100 MG PO TABS
100.0000 mg | ORAL_TABLET | Freq: Two times a day (BID) | ORAL | Status: DC
  Administered 2016-04-08: 100 mg via ORAL
  Filled 2016-04-08: qty 1

## 2016-04-08 MED ORDER — DOXYCYCLINE HYCLATE 50 MG PO CAPS: 100.0000 mg | ORAL_CAPSULE | Freq: Two times a day (BID) | ORAL | 0 refills | Status: AC

## 2016-04-08 MED ORDER — TRAMADOL HCL 50 MG PO TABS: 50.0000 mg | ORAL_TABLET | Freq: Four times a day (QID) | ORAL | 0 refills | Status: DC | PRN

## 2016-04-08 NOTE — Discharge Summary (Signed)
Discharge Summary  Kevin Vance [id number] DOB: Apr 11, 1991  PCP: No primary care provider on file.  Admit date: 04/05/2016 Discharge date: 04/08/2016  Recommendations for Outpatient Follow-up:  1. Needs to establish PCP within 1-2 weeks. 2. Rheumatology outpatient consult if joint pains not improving.   Discharge Diagnoses:  Active Hospital Problems   Diagnosis Date Noted  . 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 Cellulitis 04/06/2016  . Physical deconditioning 04/06/2016  . Fatigue 04/06/2016  . Schizoaffective disorder, bipolar type ([place]) 03/05/2016    Resolved Hospital Problems   Diagnosis Date Noted Date Resolved  No resolved problems to display.    Discharge Condition: Stable   Diet recommendation: Regular   Vitals:   04/07/16 2159 04/08/16 0629  BP: 135/75 (!) 100/58  Pulse: 74 62  Resp: 18 18  Temp: 97.4 F (36.3 C) 97.7 F (36.5 C)   History of present illness:  Kevin Vance a 25 y.o.gentleman with a history of Bipolar/schizoaffective disorder who feels that he was in his baseline state of health until approximately six weeks at that go. At that time, he had self-limited pain and swelling in his right foot that resolved spontaneously after 2-3 days. He had recurrent pain in the right foot just over one week ago, associated with swelling isolated to the second toe with mild erythema. He was evaluated in the ED and diagnosed with a cellulitis. Xray of the right foot did not show any concerning findings. He was given prescriptions for Bactrim and Keflex, which he has been taking as prescribed. He has not seen significant improvement. However, he now complains of pain and swelling in multiple joints including both great toes, his right hand, his left thumb, and his left knee. The area previously identified in his right foot is unchanged. No progressive ulcerations or skin breakdown. No drainage. No significant warmth. No fluctuance. He has not had  fever or night sweats but he reports generalized weakness and debility to the point that he cannot complete routine ADLs without difficulty. He reports that he stays in bed most of the time now due to his pain. He had an isolated episode of chest pain; it was not pleuritic in nature. No significant chest pain. No urinary retention; he had an isolated episode of incontinence. He denies frequency. No vision disturbance. No syncope. No rash.   Hospital Course:  Principal Problem:   Polyarticular arthritis - at the time of admission on 9/20 it was felt that patient likely has a polyarthralgia rather than simple cellulitis especially given lack of improvement after 5 days of oral antibiotics. He was treated with IV doxycycline given history of tick bite 2 months ago. He has not improved with any of this treatment, he has been ambulating with crutches and has been afebrile. He had extensive blood workup including Hepatic function panel, sed rate 40, CRP elevated 4, uric acid level normal, HIV negative,  ANA neg, RF neg, anti CCP antibody Pending,  antistreptolysin O antibody neg, Borrelia and parvovirus antibodies Pending, acute hepatitis panel neg, and HIV negative. Given that most of the workup is negative and he has been stable, able to perform ADLs and no fever, discussed plan of care and course of action with patient this AM. I think he needs to continue Doxycycline and if his symptoms, which could well be related to a tick borne disease, do not improve he would benefit from ID and/or Rheumatology consultation. He can do these as an outpatient as there is no other barrier to discharge  home today. He is in agreement with this plan and all questions were answered.  Active Problems:   Schizoaffective disorder, bipolar type ([place]) - home medications has been continued   Cellulitis   Physical deconditioning   Fatigue  Procedures:  None   Consultations:  None   Discharge Exam: BP (!) 100/58 (BP  Location: Right Arm)   Pulse 62   Temp 97.7 F (36.5 C) (Oral)   Resp 18   Ht 5\' 8"  (1.727 m)   Wt 83.9 kg (185 lb)   SpO2 96%   BMI 28.13 kg/m  General:  Alert, oriented, calm, in no acute distress  Eyes: pupils round and reactive to light and accomodation, clear sclerea Neck: supple, no masses, trachea mildline  Cardiovascular: RRR, no murmurs or rubs, no peripheral edema  Respiratory: clear to auscultation bilaterally, no wheezes, no crackles  Abdomen: soft, nontender, nondistended, normal bowel tones heard  Skin: dry, mild erythematous rash on dorsum of R foot, neurologically intact, no drainage, nontender touch Musculoskeletal: no joint effusions, normal range of motion, left hand/thumb, shoulder pain but exam is benign Psychiatric: appropriate affect, normal speech  Neurologic: extraocular muscles intact, clear speech, moving all extremities with intact sensorium   Discharge Instructions You were cared for by a hospitalist during your hospital stay. If you have any questions about your discharge medications or the care you received while you were in the hospital after you are discharged, you can call the unit and asked to speak with the hospitalist on call if the hospitalist that took care of you is not available. Once you are discharged, your primary care physician will handle any further medical issues. Please note that NO REFILLS for any discharge medications will be authorized once you are discharged, as it is imperative that you return to your primary care physician (or establish a relationship with a primary care physician if you do not have one) for your aftercare needs so that they can reassess your need for medications and monitor your lab values.  Discharge Instructions    Diet - low sodium heart healthy    Complete by:  As directed    Increase activity slowly    Complete by:  As directed        Medication List    TAKE these medications   doxycycline 50 MG  capsule Commonly known as:  VIBRAMYCIN Take 2 capsules (100 mg total) by mouth 2 (two) times daily.   fluticasone 50 MCG/ACT nasal spray Commonly known as:  FLONASE Place 1 spray into both nostrils daily.   ibuprofen 200 MG tablet Commonly known as:  ADVIL,MOTRIN Take 400 mg by mouth every 6 (six) hours as needed for moderate pain.   MELATONIN PO Take 1 tablet by mouth daily.   OLANZapine 20 MG tablet Commonly known as:  ZYPREXA Take 1 tablet (20 mg total) by mouth at bedtime.   Oxcarbazepine 300 MG tablet Commonly known as:  TRILEPTAL Take 300 mg by mouth at bedtime.   traMADol 50 MG tablet Commonly known as:  ULTRAM Take 1 tablet (50 mg total) by mouth every 6 (six) hours as needed for moderate pain.      No Known Allergies    The results of significant diagnostics from this hospitalization (including imaging, microbiology, ancillary and laboratory) are listed below for reference.    Significant Diagnostic Studies: Dg Chest 2 View  Result Date: 04/06/2016 CLINICAL DATA:  Chest pain. EXAM: CHEST  2 VIEW COMPARISON:  None. FINDINGS: The  heart size and mediastinal contours are within normal limits. Both lungs are clear. No pneumothorax or pleural effusion is noted. The visualized skeletal structures are unremarkable. IMPRESSION: No active cardiopulmonary disease. Electronically Signed   By: Marijo Conception, M.D.   On: 04/06/2016 08:40   Dg Foot Complete Right  Result Date: 03/31/2016 CLINICAL DATA:  Right foot pain for 2 weeks. Redness and inflammation. "Rule out soft tissue gas." EXAM: RIGHT FOOT COMPLETE - 3+ VIEW COMPARISON:  None. FINDINGS: There is no evidence of fracture or dislocation. There is no evidence of arthropathy or other focal bone abnormality. Small bone island in the third digit distal phalanx. Soft tissues are unremarkable. No soft tissue air. No radiopaque foreign body. IMPRESSION: No soft tissue gas or radiopaque foreign body. No acute osseous  abnormality. Electronically Signed   By: Jeb Levering M.D.   On: 03/31/2016 02:27    Microbiology: Recent Results (from the past 240 hour(s))  Culture, blood (Routine X 2) w Reflex to ID Panel     Status: None (Preliminary result)   Collection Time: 04/06/16  2:17 PM  Result Value Ref Range Status   Specimen Description BLOOD LEFT ARM  Final   Special Requests BOTTLES DRAWN AEROBIC AND ANAEROBIC 10CC  Final   Culture   Final    NO GROWTH < 24 HOURS Performed at [facility]    Report Status PENDING  Incomplete  Culture, blood (Routine X 2) w Reflex to ID Panel     Status: None (Preliminary result)   Collection Time: 04/06/16  2:18 PM  Result Value Ref Range Status   Specimen Description BLOOD LEFT HAND  Final   Special Requests BOTTLES DRAWN AEROBIC AND ANAEROBIC 5CC  Final   Culture   Final    NO GROWTH < 24 HOURS Performed at [facility]    Report Status PENDING  Incomplete     Labs: Basic Metabolic Panel:  Recent Labs Lab 04/05/16 2300 04/08/16 0514  NA 140 138  K 4.9 4.0  CL 102 103  CO2 29 27  GLUCOSE 113* 103*  BUN 14 12  CREATININE 0.94 0.70  CALCIUM 10.0 9.0   Liver Function Tests:  Recent Labs Lab 04/06/16 0243  AST 34  ALT 52  ALKPHOS 92  BILITOT 0.6  PROT 7.7  ALBUMIN 3.8   No results for input(s): LIPASE, AMYLASE in the last 168 hours. No results for input(s): AMMONIA in the last 168 hours. CBC:  Recent Labs Lab 04/05/16 2300 04/08/16 0514  WBC 9.6 7.0  HGB 13.8 11.8*  HCT 41.4 34.5*  MCV 91.0 87.3  PLT 427* 308   Cardiac Enzymes: No results for input(s): CKTOTAL, CKMB, CKMBINDEX, TROPONINI in the last 168 hours. BNP: BNP (last 3 results) No results for input(s): BNP in the last 8760 hours.  ProBNP (last 3 results) No results for input(s): PROBNP in the last 8760 hours.  CBG: No results for input(s): GLUCAP in the last 168 hours.  Time spent: 42 minutes were spent in preparing this discharge including  medication reconciliation, counseling, and coordination of care.  Signed:  Klover Priestly [employer]  [facility] 04/08/2016, 11:09 AM

## 2016-04-08 NOTE — Progress Notes (Addendum)
Date: April 08, 2016 Discharge orders checked for needs. [facility] follow up appt set for [id number] at 0900.  Sheet with information as to date time and location given to patient.  Velva Harman, RN, BSN, [place]   [phone]

## 2016-04-08 NOTE — Discharge Instructions (Signed)

## 2016-04-11 LAB — CULTURE, BLOOD (ROUTINE X 2)
CULTURE: NO GROWTH
CULTURE: NO GROWTH

## 2016-04-11 LAB — B. BURGDORFI ANTIBODIES

## 2016-04-12 LAB — CYCLIC CITRUL PEPTIDE ANTIBODY, IGG/IGA: CCP Antibodies IgG/IgA: 7 units (ref 0–19)

## 2016-04-13 LAB — PARVOVIRUS B19 ANTIBODY, IGG AND IGM
PAROVIRUS B19 IGG ABS: 0.2 {index} (ref 0.0–0.8)
PAROVIRUS B19 IGM ABS: 0.2 {index} (ref 0.0–0.8)

## 2016-04-13 LAB — ANTINUCLEAR ANTIBODIES, IFA: ANA Ab, IFA: NEGATIVE

## 2016-04-15 ENCOUNTER — Other Ambulatory Visit (HOSPITAL_COMMUNITY)
Admission: RE | Admit: 2016-04-15 | Discharge: 2016-04-15 | Disposition: A | Discharge status: Home / Self Care | Payer: Medicaid Other | Source: Ambulatory Visit | Attending: Internal Medicine | Admitting: Internal Medicine

## 2016-04-15 ENCOUNTER — Ambulatory Visit: Payer: Medicaid Other | Attending: Internal Medicine | Admitting: Physician Assistant

## 2016-04-15 VITALS — BP 124/81 | HR 85 | Temp 97.9°F | Resp 16 | Wt 181.2 lb

## 2016-04-15 DIAGNOSIS — F250 Schizoaffective disorder, bipolar type: Secondary | ICD-10-CM | POA: Insufficient documentation

## 2016-04-15 DIAGNOSIS — M130 Polyarthritis, unspecified: Secondary | ICD-10-CM | POA: Diagnosis not present

## 2016-04-15 DIAGNOSIS — F17200 Nicotine dependence, unspecified, uncomplicated: Secondary | ICD-10-CM | POA: Diagnosis not present

## 2016-04-15 DIAGNOSIS — Z113 Encounter for screening for infections with a predominantly sexual mode of transmission: Secondary | ICD-10-CM | POA: Diagnosis not present

## 2016-04-15 DIAGNOSIS — Z794 Long term (current) use of insulin: Secondary | ICD-10-CM | POA: Insufficient documentation

## 2016-04-15 LAB — CBC WITH DIFFERENTIAL/PLATELET
BASOS ABS: 0 {cells}/uL (ref 0–200)
Basophils Relative: 0 %
EOS PCT: 3 %
Eosinophils Absolute: 243 cells/uL (ref 15–500)
HCT: 35.3 % — ABNORMAL LOW (ref 38.5–50.0)
Hemoglobin: 12.1 g/dL — ABNORMAL LOW (ref 13.2–17.1)
LYMPHS ABS: 2349 {cells}/uL (ref 850–3900)
LYMPHS PCT: 29 %
MCH: 30.3 pg (ref 27.0–33.0)
MCHC: 34.3 g/dL (ref 32.0–36.0)
MCV: 88.3 fL (ref 80.0–100.0)
MONOS PCT: 9 %
MPV: 8.7 fL (ref 7.5–12.5)
Monocytes Absolute: 729 cells/uL (ref 200–950)
NEUTROS PCT: 59 %
Neutro Abs: 4779 cells/uL (ref 1500–7800)
PLATELETS: 428 10*3/uL — AB (ref 140–400)
RBC: 4 MIL/uL — AB (ref 4.20–5.80)
RDW: 12.2 % (ref 11.0–15.0)
WBC: 8.1 10*3/uL (ref 3.8–10.8)

## 2016-04-15 LAB — POCT URINALYSIS DIPSTICK
BILIRUBIN UA: NEGATIVE
Glucose, UA: NEGATIVE
Ketones, UA: NEGATIVE
LEUKOCYTES UA: NEGATIVE
NITRITE UA: NEGATIVE
PH UA: 5.5
PROTEIN UA: NEGATIVE
RBC UA: NEGATIVE
Spec Grav, UA: 1.025
UROBILINOGEN UA: 0.2

## 2016-04-15 MED ORDER — PREDNISONE 20 MG PO TABS: ORAL_TABLET | ORAL | 0 refills | Status: DC

## 2016-04-15 NOTE — Progress Notes (Signed)
Kevin Vance, is a 25 y.o. male  AOZ:[id number]  [id number]:[id number]  DOB - 1990/12/03  Subjective:  Chief Complaint and HPI: Kevin Vance is a 25 y.o. male here today to establish care and for a follow up visit after being hospitalized from 04/05/2016-04/08/2016 for polyarticular arthritis and cellulitis that was presumed to have started from a cellulitis of his R toe he was seen in the ED for on 03/31/2016 and treated with Keflex and Bactrim. He continued to feel worse and returned to the ED on 04/05/2016 and was admitted.  He continues to have significant pain.  His mom is here with him today.  He still has redness and swelling on his R foot of the 2nd digit, B ankle pain, L wrist and thumb pain.  He is taking 1 tramadol a day for pain and very little advil or tylenol.  Other than mildly elevated CRP and ESR, all of his labs have been normal.  His psych medications are managed by Jobie Quaker at [facility]. He was just started on Trileptal about 6 weeks prior to the joint pain starting.  He did not have an illness or travel that precipitated the joint pain.  There is a history of a possible tick bite several months ago, but all tick-borne illness testing has been negative.  No fevers since leaving the hospital. Pain is worse after rst and better with movement but the pain prevents him from being able to move normally.    Hospital course per discharge note: Principal Problem:   Polyarticular arthritis - at the time of admission on 9/20 it was felt that patient likely has a polyarthralgia rather than simple cellulitis especially given lack of improvement after 5 days of oral antibiotics. He was treated with IV doxycycline given history of tick bite 2 months ago. He has not improved with any of this treatment, he has been ambulating with crutches and has been afebrile. He had extensive blood workup including Hepatic function panel, sed rate 40, CRP elevated 4, uric acid level normal, HIV negative,  ANA  neg, RF neg, anti CCP antibody Pending, antistreptolysin O antibody neg, Borrelia and parvovirus antibodies Pending, acute hepatitis panel neg, and HIV negative. Given that most of the workup is negative and he has been stable, able to perform ADLs and no fever, discussed plan of care and course of action with patient this AM. I think he needs to continue Doxycycline and if his symptoms, which could well be related to a tick borne disease, do not improve he would benefit from ID and/or Rheumatology consultation. He can do these as an outpatient as there is no other barrier to discharge home today. He is in agreement with this plan and all questions were answered. Marland Kitchen   ED/Hospital notes reviewed.     ROS:   Constitutional:  No f/c, No night sweats, No unexplained weight loss. EENT:  No vision changes, No blurry vision, No hearing changes. No mouth, throat, or ear problems.  Respiratory: No cough, No SOB Cardiac: No CP, no palpitations GI:  No abd pain, No N/V/D. GU: No Urinary s/sx Musculoskeletal: + joint pain as listed above. Neuro: No headache, no dizziness, no motor weakness.  Skin: No rash Endocrine:  No polydipsia. No polyuria.  Psych: Denies SI/HI  No problems updated.  ALLERGIES: No Known Allergies  PAST MEDICAL HISTORY: Past Medical History:  Diagnosis Date  . Bipolar 1 disorder ([place])   . Schizoaffective disorder ([place])     MEDICATIONS AT  HOME: Prior to Admission medications   Medication Sig Start Date End Date Taking? Authorizing Provider  fluticasone (FLONASE) 50 MCG/ACT nasal spray Place 1 spray into both nostrils daily.   Yes Historical Provider, MD  ibuprofen (ADVIL,MOTRIN) 200 MG tablet Take 400 mg by mouth every 6 (six) hours as needed for moderate pain.   Yes Historical Provider, MD  MELATONIN PO Take 1 tablet by mouth daily.   Yes Historical Provider, MD  traMADol (ULTRAM) 50 MG tablet Take 1 tablet (50 mg total) by mouth every 6 (six) hours as needed for moderate  pain. 04/08/16  Yes Mir Marry Guan, MD  doxycycline (VIBRAMYCIN) 50 MG capsule Take 2 capsules (100 mg total) by mouth 2 (two) times daily. Patient not taking: Reported on 04/15/2016 04/08/16 04/19/16  Mir Marry Guan, MD  OLANZapine (ZYPREXA) 20 MG tablet Take 1 tablet (20 mg total) by mouth at bedtime. Patient not taking: Reported on 04/15/2016 03/11/16   Hildred Priest, MD  Oxcarbazepine (TRILEPTAL) 300 MG tablet Take 300 mg by mouth at bedtime.    Historical Provider, MD     Objective:  EXAM:   Vitals:   04/15/16 0906  BP: 124/81  Pulse: 85  Resp: 16  Temp: 97.9 F (36.6 C)  TempSrc: Oral  SpO2: 96%  Weight: 181 lb 3.2 oz (82.2 kg)    General appearance : A&OX3. NAD. Non-toxic-appearing.  Walks with a limp. HEENT: Atraumatic and Normocephalic.  PERRLA. EOM intact.  TM clear B. Mouth-MMM, post pharynx WNL w/o erythema, No PND. Neck: supple, no JVD. No cervical lymphadenopathy. No thyromegaly Chest/Lungs:  Breathing-non-labored, Good air entry bilaterally, breath sounds normal without rales, rhonchi, or wheezing  CVS: S1 S2 regular, no murmurs, gallops, rubs  Abdomen: Bowel sounds present, Non tender and not distended with no gaurding, rigidity or rebound. Extremities: Bilateral Lower Ext shows no edema, both legs are warm to touch with = pulse throughout.  There is mild erythema over his L wrist and thumb that is TTP and limited ROM due to pain.  There is also mild swelling.  The erythema and mild swelling is also present over B ankles and his L second toe.   Neurology:  CN II-XII grossly intact, Non focal.   Psych:  TP linear. J/I WNL. Normal speech. Appropriate eye contact and blunted affect.  Skin:  No Rash  Data Review Lab Results  Component Value Date   HGBA1C 5.5 03/08/2016     Assessment & Plan   1. Polyarticular arthritis Not improving.  He has had IV antibiotics and extensive bloodwork.  Unsure if Trileptal could be causing this, but  very low likelihood.  I have advised them to check with their Psychiatrist about alternatives if that could be appropriate.   Prednisone 11m-3,3,3,2,2,2,1,1,1 take each days dose in am with food #18 Refer to Rheumatology Recheck ESR and CRP to see if any changes Tylenol every 4 hrs as needed Check Uriprobe for GC/Chlam/urine culture  2. Schizoaffective disorder, bipolar type ([place] F/up with JJobie Quaker Patient have been counseled extensively about nutrition and exercise F/up to establish with PCP in 2 weeks and to recheck arthralgias  The patient was given clear instructions to go to ER or return to medical center if symptoms don't improve, worsen or new problems develop. The patient verbalized understanding. The patient was told to call to get lab results if they haven't heard anything in the next week.     AFreeman Caldron PA-C [facility]  [place], [place]   04/15/2016, 9:12 AM

## 2016-04-15 NOTE — Progress Notes (Signed)
Pt is in the office today for hospitalization follow up PT states his pain level is a 8

## 2016-04-16 LAB — SEDIMENTATION RATE: SED RATE: 82 mm/h — AB (ref 0–15)

## 2016-04-16 LAB — URINE CULTURE: Organism ID, Bacteria: NO GROWTH

## 2016-04-18 LAB — URINE CYTOLOGY ANCILLARY ONLY
Chlamydia: NEGATIVE
NEISSERIA GONORRHEA: NEGATIVE
TRICH (WINDOWPATH): NEGATIVE

## 2016-04-18 LAB — C-REACTIVE PROTEIN: CRP: 57.5 mg/L — AB (ref <8.0)

## 2016-04-20 ENCOUNTER — Telehealth: Payer: Self-pay

## 2016-04-20 NOTE — Telephone Encounter (Signed)
Contacted pt to go over lab results pt didn't answer lvm for pt to give me a call at his earliest convenience

## 2016-04-21 ENCOUNTER — Telehealth: Payer: Self-pay

## 2016-04-21 NOTE — Telephone Encounter (Signed)
Contacted pt to go over lab results pt didn't answer informed pt I will be sending results out today and if he has any questions to give me a call

## 2016-05-12 ENCOUNTER — Ambulatory Visit: Payer: Medicaid Other | Attending: Internal Medicine | Admitting: Internal Medicine

## 2016-05-12 ENCOUNTER — Encounter: Payer: Self-pay | Admitting: Internal Medicine

## 2016-05-12 VITALS — BP 117/71 | HR 88 | Temp 97.6°F | Resp 18 | Ht 68.0 in | Wt 178.6 lb

## 2016-05-12 DIAGNOSIS — F17200 Nicotine dependence, unspecified, uncomplicated: Secondary | ICD-10-CM | POA: Diagnosis not present

## 2016-05-12 DIAGNOSIS — M1990 Unspecified osteoarthritis, unspecified site: Secondary | ICD-10-CM | POA: Diagnosis present

## 2016-05-12 DIAGNOSIS — F250 Schizoaffective disorder, bipolar type: Secondary | ICD-10-CM | POA: Diagnosis not present

## 2016-05-12 DIAGNOSIS — M130 Polyarthritis, unspecified: Secondary | ICD-10-CM

## 2016-05-12 MED ORDER — PREDNISONE 20 MG PO TABS: 20.0000 mg | ORAL_TABLET | Freq: Two times a day (BID) | ORAL | 0 refills | Status: DC

## 2016-05-12 MED ORDER — TRAMADOL HCL 50 MG PO TABS: 50.0000 mg | ORAL_TABLET | Freq: Four times a day (QID) | ORAL | 0 refills | Status: DC | PRN

## 2016-05-12 NOTE — Patient Instructions (Signed)

## 2016-05-12 NOTE — Progress Notes (Signed)
Patient is here for FU joint swelling.  Patient complains of hand and foot right side swelling in joints.  Patient has taken medication today and patient has eaten today.  Patient declined the flu vaccine today.

## 2016-05-12 NOTE — Progress Notes (Signed)
Kevin Vance, is a 25 y.o. male  HEN:[id number]  [id number]:[id number]  DOB - [birth year]  Chief Complaint  Patient presents with  . Edema      Subjective:   Kevin Vance is a 25 y.o. male here today for a follow up visitafter being hospitalized from 04/05/2016-04/08/2016 for polyarticular arthritis and cellulitis that was presumed to have started from a cellulitis of his R toe he was seen in the ED for on 03/31/2016 and treated with Keflex and Bactrim. He continued to feel worse and returned to the ED on 04/05/2016 and was admitted.  He continues to have significant pain.  His mom is here with him today. He was seen here recently for HFU and given a trial of steroid. He was also referred to Rheumatologist but has not ben processed. Extensive work-up for autoimmune disease is negative so far. Only ESR and CRP were elevated. ANA negative. His psych medications are managed by Jobie Quaker at [facility]. He was just started on Trileptal about 6 weeks prior to the joint pain starting. He did not have an illness or travel that precipitated the joint pain. His mom has tried to withhold medication to see if there will be improvement in pain and swelling, not sure of any difference yet. He continues to have swollen and painful right middle finger at the knuckle joint. Patient has No headache, No chest pain, No abdominal pain - No Nausea, No new weakness tingling or numbness, No Cough - SOB.  ALLERGIES: No Known Allergies  PAST MEDICAL HISTORY: Past Medical History:  Diagnosis Date  . Bipolar 1 disorder ([place])   . Schizoaffective disorder ([place])     MEDICATIONS AT HOME: Prior to Admission medications   Medication Sig Start Date End Date Taking? Authorizing Provider  fluticasone (FLONASE) 50 MCG/ACT nasal spray Place 1 spray into both nostrils daily.   Yes Historical Provider, MD  ibuprofen (ADVIL,MOTRIN) 200 MG tablet Take 400 mg by mouth every 6 (six) hours as needed for moderate pain.   Yes  Historical Provider, MD  LORazepam (ATIVAN) 1 MG tablet Take 1 mg by mouth at bedtime as needed. 03/12/16  Yes Historical Provider, MD  MELATONIN PO Take 1 tablet by mouth daily.   Yes Historical Provider, MD  Oxcarbazepine (TRILEPTAL) 300 MG tablet Take 300 mg by mouth at bedtime.   Yes Historical Provider, MD  predniSONE (DELTASONE) 20 MG tablet Take 1 tablet (20 mg total) by mouth 2 (two) times daily with a meal. 05/12/16  Yes Megen Madewell E Doreene Burke, MD  traMADol (ULTRAM) 50 MG tablet Take 1 tablet (50 mg total) by mouth every 6 (six) hours as needed for moderate pain. 05/12/16  Yes Tresa Garter, MD    Objective:   Vitals:   05/12/16 1225  BP: 117/71  Pulse: 88  Resp: 18  Temp: 97.6 F (36.4 C)  TempSrc: Oral  SpO2: 97%  Weight: 178 lb 9.6 oz (81 kg)  Height: '5\' 8"'$  (1.727 m)   Exam General appearance : Awake, alert, not in any distress. Speech Clear. Not toxic looking HEENT: Atraumatic and Normocephalic, pupils equally reactive to light and accomodation Neck: Supple, no JVD. No cervical lymphadenopathy.  Chest: Good air entry bilaterally, no added sounds  CVS: S1 S2 regular, no murmurs.  Abdomen: Bowel sounds present, Non tender and not distended with no gaurding, rigidity or rebound. Extremities: Swollen and tender right middle finger at MCP joint, sausage shaped, warm to touch, no erythema. B/L Lower Ext shows no  edema, both legs are warm to touch Neurology: Awake alert, and oriented X 3, CN II-XII intact, Non focal Skin: No Rash  Data Review Lab Results  Component Value Date   HGBA1C 5.5 03/08/2016    Assessment & Plan   1. Polyarticular arthritis  - traMADol (ULTRAM) 50 MG tablet; Take 1 tablet (50 mg total) by mouth every 6 (six) hours as needed for moderate pain.  Dispense: 60 tablet; Refill: 0 - predniSONE (DELTASONE) 20 MG tablet; Take 1 tablet (20 mg total) by mouth 2 (two) times daily with a meal.  Dispense: 20 tablet; Refill: 0 - ANA - Sedimentation  Rate - C-reactive protein  2. Schizoaffective disorder, bipolar type ([place])  Follow up with Psychiatry, medications may need to be adjusted or changed  3. Tobacco use disorder  Kevin Vance was counseled on the dangers of tobacco use, and was advised to quit. Reviewed strategies to maximize success, including removing cigarettes and smoking materials from environment, stress management and support of family/friends.  Patient have been counseled extensively about nutrition and exercise.   Return in about 4 weeks (around 06/09/2016) for Follow up Pain and comorbidities.  The patient was given clear instructions to go to ER or return to medical center if symptoms don't improve, worsen or new problems develop. The patient verbalized understanding. The patient was told to call to get lab results if they haven't heard anything in the next week.   This note has been created with Surveyor, quantity. Any transcriptional errors are unintentional.    Angelica Chessman, MD, [place], [place], [place], [place] and [place], [place]   05/12/2016, 12:56 PM

## 2016-05-13 LAB — SEDIMENTATION RATE: SED RATE: 67 mm/h — AB (ref 0–15)

## 2016-05-13 LAB — ANA: Anti Nuclear Antibody(ANA): NEGATIVE

## 2016-05-13 LAB — C-REACTIVE PROTEIN: CRP: 38.8 mg/L — AB (ref <8.0)

## 2016-05-17 ENCOUNTER — Telehealth: Payer: Self-pay | Admitting: *Deleted

## 2016-05-17 NOTE — Telephone Encounter (Signed)
Patient verified DOB Patient is aware of lab results showing no significant change compared to previous results. Patient is advised to continue current medications and speak with psychiatry for medication review. Patient is also aware of Referral being placed to [facility] in [place]. Mother took contact and location information and states she will contact the office this morning to schedule the first available. No further questions at this tim.

## 2016-05-17 NOTE — Telephone Encounter (Signed)
-----   Message from Tresa Garter, MD sent at 05/17/2016 10:37 AM EDT ----- Please inform patient that no significant change in lab result when compared to previous tests. Continue current medications, follow up with Psychiatrist for medication review and keep appointment with Rheumatologist when scheduled.

## 2016-09-23 ENCOUNTER — Ambulatory Visit: Payer: Medicaid Other | Attending: Internal Medicine | Admitting: Physician Assistant

## 2016-09-23 VITALS — BP 151/92 | HR 103 | Temp 98.2°F | Resp 16 | Wt 205.0 lb

## 2016-09-23 DIAGNOSIS — M546 Pain in thoracic spine: Secondary | ICD-10-CM | POA: Insufficient documentation

## 2016-09-23 DIAGNOSIS — R030 Elevated blood-pressure reading, without diagnosis of hypertension: Secondary | ICD-10-CM | POA: Diagnosis not present

## 2016-09-23 DIAGNOSIS — F259 Schizoaffective disorder, unspecified: Secondary | ICD-10-CM | POA: Diagnosis not present

## 2016-09-23 DIAGNOSIS — H6123 Impacted cerumen, bilateral: Secondary | ICD-10-CM | POA: Diagnosis not present

## 2016-09-23 DIAGNOSIS — Z87891 Personal history of nicotine dependence: Secondary | ICD-10-CM | POA: Diagnosis not present

## 2016-09-23 DIAGNOSIS — Z79899 Other long term (current) drug therapy: Secondary | ICD-10-CM | POA: Insufficient documentation

## 2016-09-23 DIAGNOSIS — M62838 Other muscle spasm: Secondary | ICD-10-CM | POA: Diagnosis not present

## 2016-09-23 DIAGNOSIS — Z7951 Long term (current) use of inhaled steroids: Secondary | ICD-10-CM | POA: Diagnosis not present

## 2016-09-23 DIAGNOSIS — H9190 Unspecified hearing loss, unspecified ear: Secondary | ICD-10-CM | POA: Diagnosis present

## 2016-09-23 MED ORDER — IBUPROFEN 600 MG PO TABS: ORAL_TABLET | ORAL | 1 refills | Status: DC

## 2016-09-23 MED ORDER — METHOCARBAMOL 500 MG PO TABS: 500.0000 mg | ORAL_TABLET | Freq: Four times a day (QID) | ORAL | 0 refills | Status: DC

## 2016-09-23 NOTE — Progress Notes (Signed)
Pt is taking Methotrexate and folic acid but doesn't know the dosage

## 2016-09-23 NOTE — Patient Instructions (Signed)
Check blood pressure 3-4 times/week and record and bring to next visit

## 2016-09-23 NOTE — Progress Notes (Signed)
Kevin Vance, is a 26 y.o. male  SWN:[id number]  [id number]  DOB - [birth year]  Subjective:  Chief Complaint and HPI: Kevin Vance is a 26 y.o. male here today for decreased hearing and suspected wax build up in both ears.  Also c/o 1 week h/o thoracic back pain B sides.  No radiating pain.  NKI.  No new activities.  Hasn't had problems with BP.  Denies dizziness, blurry vision, HA, CP, SOB, no DOE.  Social-former smoker; no cigs, no drugs  ROS:   Constitutional:  No f/c, No night sweats, No unexplained weight loss. EENT:  No vision changes, No blurry vision, + hearing changes. No mouth, throat, or ear problems.  Respiratory: No cough, No SOB Cardiac: No CP, no palpitations GI:  No abd pain, No N/V/D. GU: No Urinary s/sx Musculoskeletal: +back pain Neuro: No headache, no dizziness, no motor weakness.  Skin: No rash Endocrine:  No polydipsia. No polyuria.  Psych: Denies SI/HI  No problems updated.  ALLERGIES: No Known Allergies  PAST MEDICAL HISTORY: Past Medical History:  Diagnosis Date  . Bipolar 1 disorder ([place])   . Schizoaffective disorder ([place])     MEDICATIONS AT HOME: Prior to Admission medications   Medication Sig Start Date End Date Taking? Authorizing Provider  fluticasone (FLONASE) 50 MCG/ACT nasal spray Place 1 spray into both nostrils daily.   Yes Historical Provider, MD  predniSONE (DELTASONE) 20 MG tablet Take 1 tablet (20 mg total) by mouth 2 (two) times daily with a meal. 05/12/16  Yes Tresa Garter, MD  ibuprofen (ADVIL,MOTRIN) 600 MG tablet 1 tab 3 times daily with food for 7 days then as needed for pain 09/23/16   Argentina Donovan, PA-C  LORazepam (ATIVAN) 1 MG tablet Take 1 mg by mouth at bedtime as needed. 03/12/16   Historical Provider, MD  MELATONIN PO Take 1 tablet by mouth daily.    Historical Provider, MD  methocarbamol (ROBAXIN) 500 MG tablet Take 1 tablet (500 mg total) by mouth 4 (four) times daily. X 7 days then prn muscle spasm  09/23/16   Argentina Donovan, PA-C  Oxcarbazepine (TRILEPTAL) 300 MG tablet Take 300 mg by mouth at bedtime.    Historical Provider, MD  traMADol (ULTRAM) 50 MG tablet Take 1 tablet (50 mg total) by mouth every 6 (six) hours as needed for moderate pain. Patient not taking: Reported on 09/23/2016 05/12/16   Tresa Garter, MD     Objective:  EXAM:   Vitals:   09/23/16 0921  BP: (!) 151/92  Pulse: (!) 103  Resp: 16  Temp: 98.2 F (36.8 C)  TempSrc: Oral  SpO2: 95%  Weight: 205 lb (93 kg)    General appearance : A&OX3. NAD. Non-toxic-appearing HEENT: Atraumatic and Normocephalic.  PERRLA. EOM intact.  TM initially completely obstructed with cerumen B.  See procedure note below.  B TM WNL after irrigation Mouth-MMM, post pharynx WNL w/o erythema, No PND. Neck: supple, no JVD. No cervical lymphadenopathy. No thyromegaly Chest/Lungs:  Breathing-non-labored, Good air entry bilaterally, breath sounds normal without rales, rhonchi, or wheezing  CVS: S1 S2 regular, no murmurs, gallops, rubs  Abdomen: Bowel sounds present, Non tender and not distended with no gaurding, rigidity or rebound. Extremities: Bilateral Lower Ext shows no edema, both legs are warm to touch with = pulse throughout Back:  Full S&ROM with flexion/extension/rotation.  No bony TTP.  There is paraspinus muscle spasm that is TTP on B sides of the spine.  Neg SLR  B.  DTR=B.   Neurology:  CN II-XII grossly intact, Non focal.   Psych:  TP linear. J/I WNL. Normal speech. Appropriate eye contact and affect.  Skin:  No Rash  Data Review Lab Results  Component Value Date   HGBA1C 5.5 03/08/2016   Procedure:  Debrox was placed into B ears then a cotton ball was placed over the ear canal.  20 mins allowed.  Then irrigated with warm water and peroxide as tolerated.  A large amount of dark wax was removed form both ears.  There is no remaining cerumen in either ear.  Patient tolerated the procedure well without any problem.     Assessment & Plan   1. Acute bilateral thoracic back pain Suspect musculoskeletal - ibuprofen (ADVIL,MOTRIN) 600 MG tablet; 1 tab 3 times daily with food for 7 days then as needed for pain  Dispense: 40 tablet; Refill: 1  2. Muscle spasm - methocarbamol (ROBAXIN) 500 MG tablet; Take 1 tablet (500 mg total) by mouth 4 (four) times daily. X 7 days then prn muscle spasm  Dispense: 90 tablet; Refill: 0  3. Bilateral impacted cerumen Resolved  4. Increased BP w/o diagnosis of htn- Check blood pressure 3-4 times/week and record and bring to next visit  Patient have been counseled extensively about nutrition and exercise  Return in about 3 weeks (around 10/14/2016) for Dr Doreene Burke; f/up elevated BP.  The patient was given clear instructions to go to ER or return to medical center if symptoms don't improve, worsen or new problems develop. The patient verbalized understanding. The patient was told to call to get lab results if they haven't heard anything in the next week.     Freeman Caldron, PA-C [facility] and [place], [place]   09/23/2016, 9:55 AMPatient ID: Kevin Vance, male   DOB: [birth year], 26 y.o.   MRN: [id number]

## 2016-10-12 ENCOUNTER — Encounter: Payer: Self-pay | Admitting: Internal Medicine

## 2016-10-12 ENCOUNTER — Ambulatory Visit: Payer: Medicaid Other | Attending: Internal Medicine | Admitting: Internal Medicine

## 2016-10-12 VITALS — BP 129/76 | HR 78 | Temp 98.4°F | Resp 18 | Ht 68.0 in | Wt 206.4 lb

## 2016-10-12 DIAGNOSIS — Z79899 Other long term (current) drug therapy: Secondary | ICD-10-CM | POA: Diagnosis not present

## 2016-10-12 DIAGNOSIS — F1290 Cannabis use, unspecified, uncomplicated: Secondary | ICD-10-CM | POA: Diagnosis not present

## 2016-10-12 DIAGNOSIS — F319 Bipolar disorder, unspecified: Secondary | ICD-10-CM | POA: Diagnosis not present

## 2016-10-12 DIAGNOSIS — F17210 Nicotine dependence, cigarettes, uncomplicated: Secondary | ICD-10-CM | POA: Insufficient documentation

## 2016-10-12 DIAGNOSIS — H6123 Impacted cerumen, bilateral: Secondary | ICD-10-CM | POA: Diagnosis not present

## 2016-10-12 DIAGNOSIS — F17200 Nicotine dependence, unspecified, uncomplicated: Secondary | ICD-10-CM | POA: Diagnosis not present

## 2016-10-12 DIAGNOSIS — R030 Elevated blood-pressure reading, without diagnosis of hypertension: Secondary | ICD-10-CM | POA: Insufficient documentation

## 2016-10-12 DIAGNOSIS — M47819 Spondylosis without myelopathy or radiculopathy, site unspecified: Secondary | ICD-10-CM | POA: Insufficient documentation

## 2016-10-12 DIAGNOSIS — F250 Schizoaffective disorder, bipolar type: Secondary | ICD-10-CM | POA: Insufficient documentation

## 2016-10-12 DIAGNOSIS — M479 Spondylosis, unspecified: Secondary | ICD-10-CM | POA: Diagnosis present

## 2016-10-12 DIAGNOSIS — M0608 Rheumatoid arthritis without rheumatoid factor, vertebrae: Secondary | ICD-10-CM

## 2016-10-12 NOTE — Progress Notes (Signed)
Patient is here for HTN FU  Patient denies pain at this time  Patient has taken medication today. Patient has not eaten today.  Patient denies any suicidal ideations.

## 2016-10-12 NOTE — Progress Notes (Signed)
Kevin Vance, is a 26 y.o. male  SNK:[id number]  [id number]:[id number]  DOB - 09-Apr-1991  No chief complaint on file.      Subjective:   Kevin Vance is a 26 y.o. male here today for a follow up visit. 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 He was noticed to have elevated BP in the past and told to follow up for BP monitoring and possible management. Patient is doing much better,joint pain and swelling has significantly improved. He has stopped using marijuana but still smokes cigarette sparingly. He stopped using illicit drugs as well. He celebrates 7 months of being "clean". Patient has No headache, No chest pain, No abdominal pain - No Nausea, No new weakness tingling or numbness, No Cough - SOB.  Problem  Seronegative Spondyloarthropathy    ALLERGIES: No Known Allergies  PAST MEDICAL HISTORY: Past Medical History:  Diagnosis Date  . Bipolar 1 disorder ([place])   . Schizoaffective disorder ([place])     MEDICATIONS AT HOME: Prior to Admission medications   Medication Sig Start Date End Date Taking? Authorizing Provider  folic acid (FOLVITE) 1 MG tablet Take by mouth. 08/19/16  Yes Historical Provider, MD  methotrexate (RHEUMATREX) 2.5 MG tablet Take by mouth. 08/19/16  Yes Historical Provider, MD  fluticasone (FLONASE) 50 MCG/ACT nasal spray Place 1 spray into both nostrils daily.    Historical Provider, MD  ibuprofen (ADVIL,MOTRIN) 600 MG tablet 1 tab 3 times daily with food for 7 days then as needed for pain 09/23/16   Argentina Donovan, PA-C  LORazepam (ATIVAN) 1 MG tablet Take 1 mg by mouth at bedtime as needed. 03/12/16   Historical Provider, MD  MELATONIN PO Take 1 tablet by mouth daily.    Historical Provider, MD  methocarbamol (ROBAXIN) 500 MG tablet Take 1 tablet (500 mg total) by mouth 4 (four) times daily. X 7 days then prn muscle spasm 09/23/16   Dionne Bucy McClung, PA-C    OLANZapine (ZYPREXA) 20 MG tablet Take by mouth.    Historical Provider, MD  Oxcarbazepine (TRILEPTAL) 300 MG tablet Take 300 mg by mouth at bedtime.    Historical Provider, MD  predniSONE (DELTASONE) 20 MG tablet Take 1 tablet (20 mg total) by mouth 2 (two) times daily with a meal. 05/12/16   Tresa Garter, MD    Objective:   Vitals:   10/12/16 1018  BP: 129/76  Pulse: 78  Resp: 18  Temp: 98.4 F (36.9 C)  TempSrc: Oral  SpO2: 98%  Weight: 206 lb 6.4 oz (93.6 kg)  Height: '5\' 8"'$  (1.727 m)   Exam General appearance : Awake, alert, not in any distress. Speech Clear. Not toxic looking, mild obesity HEENT: Atraumatic and Normocephalic, pupils equally reactive to light and accomodation Neck: Supple, no JVD. No cervical lymphadenopathy.  Chest: Good air entry bilaterally, no added sounds  CVS: S1 S2 regular, no murmurs.  Abdomen: Bowel sounds present, Non tender and not distended with no gaurding, rigidity or rebound. Extremities: B/L Lower Ext shows no edema, both legs are warm to touch Neurology: Awake alert, and oriented X 3, CN II-XII intact, Non focal Skin: No Rash  Data Review Lab Results  Component Value Date   HGBA1C 5.5 03/08/2016    Assessment & Plan   1. Seronegative spondyloarthropathy: HLA B27 +  - Continue methotrexate and folic acid - Liver function reviewed, no need for new labs today - Follow  up with Rheumatologist as scheduled  2. Tobacco use disorder  - Patient is 7 months clean on Marijuana use disorder  Lawrnce was counseled on the dangers of tobacco use, and was advised to quit. Reviewed strategies to maximize success, including removing cigarettes and smoking materials from environment, stress management and support of family/friends.  3. Elevated BP without diagnosis of hypertension  - BP is normal today - Continue to monitor  We have discussed target BP range and blood pressure goal. I have advised patient to check BP regularly and to  call us back or report to clinic if the numbers are consistently higher than 140/90. We discussed the importance of compliance with medical therapy and DASH diet recommended, consequences of uncontrolled hypertension discussed.   Patient have been counseled extensively about nutrition and exercise. Other issues discussed during this visit include: low cholesterol diet, weight control and daily exercise, importance of adherence with medications and regular follow-up.  Return in about 1 year (around 10/12/2017) for Follow up HTN, Follow up Pain and comorbidities.  The patient was given clear instructions to go to ER or return to medical center if symptoms don't improve, worsen or new problems develop. The patient verbalized understanding. The patient was told to call to get lab results if they haven't heard anything in the next week.   This note has been created with Surveyor, quantity. Any transcriptional errors are unintentional.    Angelica Chessman, MD, [place], [place], [place] and [facility] [place], [place]   10/12/2016, 11:07 AM

## 2016-10-22 ENCOUNTER — Other Ambulatory Visit: Payer: Self-pay | Admitting: Physician Assistant

## 2016-10-22 DIAGNOSIS — H6123 Impacted cerumen, bilateral: Secondary | ICD-10-CM

## 2016-12-01 ENCOUNTER — Other Ambulatory Visit: Payer: Self-pay | Admitting: Physician Assistant

## 2016-12-01 DIAGNOSIS — H6123 Impacted cerumen, bilateral: Secondary | ICD-10-CM

## 2016-12-01 DIAGNOSIS — M546 Pain in thoracic spine: Secondary | ICD-10-CM

## 2016-12-09 ENCOUNTER — Other Ambulatory Visit: Payer: Self-pay | Admitting: Internal Medicine

## 2016-12-09 DIAGNOSIS — H6123 Impacted cerumen, bilateral: Secondary | ICD-10-CM

## 2017-02-01 ENCOUNTER — Encounter (HOSPITAL_COMMUNITY): Payer: Self-pay | Admitting: Family Medicine

## 2017-02-01 ENCOUNTER — Ambulatory Visit (HOSPITAL_COMMUNITY)
Admission: EM | Admit: 2017-02-01 | Discharge: 2017-02-01 | Disposition: A | Discharge status: Home / Self Care | Payer: Medicaid Other | Attending: Emergency Medicine | Admitting: Emergency Medicine

## 2017-02-01 DIAGNOSIS — R1011 Right upper quadrant pain: Secondary | ICD-10-CM | POA: Diagnosis not present

## 2017-02-01 LAB — POCT URINALYSIS DIP (DEVICE)
BILIRUBIN URINE: NEGATIVE
Glucose, UA: NEGATIVE mg/dL
Hgb urine dipstick: NEGATIVE
KETONES UR: NEGATIVE mg/dL
Leukocytes, UA: NEGATIVE
Nitrite: NEGATIVE
PH: 5.5 (ref 5.0–8.0)
Protein, ur: NEGATIVE mg/dL
Specific Gravity, Urine: >=1.03 (ref 1.005–1.030)
Urobilinogen, UA: 0.2 mg/dL (ref 0.0–1.0)

## 2017-02-01 MED ORDER — OMEPRAZOLE 40 MG PO CPDR
40.0000 mg | DELAYED_RELEASE_CAPSULE | Freq: Two times a day (BID) | ORAL | 0 refills | Status: AC
Start: 2017-02-01 — End: 2017-02-15

## 2017-02-01 MED ORDER — DICYCLOMINE HCL 20 MG PO TABS: 20.0000 mg | ORAL_TABLET | Freq: Two times a day (BID) | ORAL | 0 refills | Status: AC

## 2017-02-01 NOTE — ED Triage Notes (Signed)
Pt here for 3 weeks of constant right flank pain. Denies urinary symptoms. Denies injury or heavy lifting.

## 2017-02-01 NOTE — ED Provider Notes (Signed)
CSN: [id number]     Arrival date & time 02/01/17  1001 History   None    Chief Complaint  Patient presents with  . Flank Pain   (Consider location/radiation/quality/duration/timing/severity/associated sxs/prior Treatment) 26 year old male presents to clinic for 3 weeks of lower abdominal and flank pain. No fever, or chills, nausea, vomiting, has had diarrhea. Symptoms are not improved or worsened with food, no recent travel, no dysuria, frequency, urgency, no hematuria, has not lifted anything heavy, no twisting, no evidence of trauma. Does have his gallbladder, does have his appendix, otherwise in good health.   The history is provided by the patient.  Flank Pain  Associated symptoms include abdominal pain.    Past Medical History:  Diagnosis Date  . Bipolar 1 disorder ([place])   . Schizoaffective disorder [facility])    Past Surgical History:  Procedure Laterality Date  . ROTATOR CUFF REPAIR     No family history on file. Social History  Substance Use Topics  . Smoking status: Former Smoker    Quit date: 01/16/2016  . Smokeless tobacco: Never Used  . Alcohol use No    Review of Systems  Constitutional: Negative.   HENT: Negative.   Respiratory: Negative.   Cardiovascular: Negative.   Gastrointestinal: Positive for abdominal pain and diarrhea. Negative for vomiting.  Genitourinary: Positive for flank pain.  Musculoskeletal: Negative for back pain, neck pain and neck stiffness.  Skin: Negative.   Neurological: Negative.     Allergies  Patient has no known allergies.  Home Medications   Prior to Admission medications   Medication Sig Start Date End Date Taking? Authorizing Provider  dicyclomine (BENTYL) 20 MG tablet Take 1 tablet (20 mg total) by mouth 2 (two) times daily. 02/01/17   Barnet Glasgow, NP  fluticasone (FLONASE) 50 MCG/ACT nasal spray Place 1 spray into both nostrils daily.    [provider]  folic acid (FOLVITE) 1 MG tablet Take by mouth. 08/19/16    [provider]  ibuprofen (ADVIL,MOTRIN) 600 MG tablet TAKE 1 TABLET BY MOUTH THREE TIMES DAILY WITH FOOD FOR 7 DAYS THEN TAKE AS NEEDED FOR PAIN 12/01/16   Tresa Garter, MD  LORazepam (ATIVAN) 1 MG tablet Take 1 mg by mouth at bedtime as needed. 03/12/16   [provider]  MELATONIN PO Take 1 tablet by mouth daily.    [provider]  methocarbamol (ROBAXIN) 500 MG tablet TAKE 1 TABLET BY MOUTH 4 TIMES DAILY FOR 7 DAYS THEN AS NEEDED FOR MUSCLE SPASM. MUST HAVE OFFICE VISIT BEFORE FURTHER REFILLS. 12/09/16   Tresa Garter, MD  methotrexate (RHEUMATREX) 2.5 MG tablet Take by mouth. 08/19/16   [provider]  OLANZapine (ZYPREXA) 20 MG tablet Take by mouth.    [provider]  omeprazole (PRILOSEC) 40 MG capsule Take 1 capsule (40 mg total) by mouth 2 (two) times daily. 02/01/17 02/15/17  Barnet Glasgow, NP  Oxcarbazepine (TRILEPTAL) 300 MG tablet Take 300 mg by mouth at bedtime.    [provider]  predniSONE (DELTASONE) 20 MG tablet Take 1 tablet (20 mg total) by mouth 2 (two) times daily with a meal. 05/12/16   Tresa Garter, MD   Meds Ordered and Administered this Visit  Medications - No data to display  BP 123/83   Pulse 62   Temp 98 F (36.7 C)   Resp 18   SpO2 97%  No data found.   Physical Exam  Constitutional: He is oriented to person, place, and  time. He appears well-developed and well-nourished. No distress.  HENT:  Head: Normocephalic and atraumatic.  Right Ear: External ear normal.  Left Ear: External ear normal.  Eyes: Conjunctivae are normal.  Neck: Normal range of motion.  Cardiovascular: Normal rate and regular rhythm.   Pulmonary/Chest: Effort normal and breath sounds normal.  Abdominal: Soft. Normal appearance and bowel sounds are normal. There is tenderness in the right upper quadrant. There is positive Murphy's sign. There is no rebound and no CVA tenderness.  Neurological: He is alert and  oriented to person, place, and time.  Skin: Skin is warm and dry. Capillary refill takes less than 2 seconds. He is not diaphoretic.  Psychiatric: He has a normal mood and affect. His behavior is normal.  Nursing note and vitals reviewed.   Urgent Care Course     Procedures (including critical care time)  Labs Review Labs Reviewed  POCT URINALYSIS DIP (DEVICE)    Imaging Review No results found.   MDM   1. Right upper quadrant abdominal pain    No CVA tenderness, UA unremarkable, pain noted with positive Murphy's sign, no rebound tenderness, negative straight leg raise. No signs of acute distress, believe this is safe to treat as an outpatient, treating presumptively with Prilosec, Bentyl, recommend a bland, clear liquid diet, follow-up with GI for further evaluation and imaging to rule out cholecystitis biliary stasis, or other GI issues, strict follow-up guidelines provided good to the ER if any worsening of his symptoms, or develop a fever.    Barnet Glasgow, NP 02/01/17 1357

## 2017-02-01 NOTE — Discharge Instructions (Signed)
Your urine results are negative, your symptoms are consistent with possible gallbladder issues. This will need further imaging and labs are not available to [place] here. Keep your appointment with her primary care provider, or schedule an appointment with a GI doctor, as they have the ability to adequately assess this condition. If at any time your pain worsens, or if he develop a fever, elevated heart rate, or worsening symptoms, go to the emergency room. Eat a very bland diet, and I prescribed 2 medicines that may help with her symptoms. Take them as directed

## 2017-02-15 ENCOUNTER — Ambulatory Visit: Payer: Medicaid Other | Attending: Internal Medicine | Admitting: Internal Medicine

## 2017-02-15 ENCOUNTER — Ambulatory Visit (HOSPITAL_COMMUNITY)
Admission: RE | Admit: 2017-02-15 | Discharge: 2017-02-15 | Disposition: A | Discharge status: Home / Self Care | Payer: Medicaid Other | Source: Ambulatory Visit | Attending: Internal Medicine | Admitting: Internal Medicine

## 2017-02-15 ENCOUNTER — Encounter: Payer: Self-pay | Admitting: Internal Medicine

## 2017-02-15 VITALS — BP 135/86 | HR 74 | Temp 97.5°F | Resp 16 | Wt 205.6 lb

## 2017-02-15 DIAGNOSIS — Z0000 Encounter for general adult medical examination without abnormal findings: Secondary | ICD-10-CM | POA: Insufficient documentation

## 2017-02-15 DIAGNOSIS — F17200 Nicotine dependence, unspecified, uncomplicated: Secondary | ICD-10-CM

## 2017-02-15 DIAGNOSIS — Z79899 Other long term (current) drug therapy: Secondary | ICD-10-CM | POA: Diagnosis not present

## 2017-02-15 DIAGNOSIS — F250 Schizoaffective disorder, bipolar type: Secondary | ICD-10-CM | POA: Diagnosis not present

## 2017-02-15 DIAGNOSIS — Z720 Tobacco use: Secondary | ICD-10-CM | POA: Insufficient documentation

## 2017-02-15 DIAGNOSIS — R1011 Right upper quadrant pain: Secondary | ICD-10-CM

## 2017-02-15 NOTE — Progress Notes (Signed)
Pt states

## 2017-02-15 NOTE — Progress Notes (Signed)
Kevin Vance, is a 26 y.o. male  [id number]:[id number]  [id number]  DOB - Mar 18, 1991  Chief Complaint  Patient presents with  . Annual Exam  . Abdominal Pain      Subjective:   Kevin Vance is a 26 y.o. male here today for a ED visit follow up. Patient was seen in the UC 2 weeks ago for RUQ pain, was treated with Bentyl and PPI. Pain has persisted, RUQ, sometimes to RLQ, 8/10, non-radiating, occasionally worse after eating, no N/V, occasional diarrhea, no blood in stool or urine, no dysuria or frequency. Patient reports no fever, pain does not wake him up at night but worse when lying on the right side. No known relieving factor. He is on medications listed below, no recent change in medications. Denies any suicidal ideation or thought. Patient has No headache, No chest pain, No new weakness tingling or numbness, No Cough - SOB.  Problem  Right Upper Quadrant Pain    ALLERGIES: No Known Allergies  PAST MEDICAL HISTORY: Past Medical History:  Diagnosis Date  . Bipolar 1 disorder ([place])   . Schizoaffective disorder ([place])    MEDICATIONS AT HOME: Prior to Admission medications   Medication Sig Start Date End Date Taking? Authorizing Provider  dicyclomine (BENTYL) 20 MG tablet Take 1 tablet (20 mg total) by mouth 2 (two) times daily. 02/01/17   Barnet Glasgow, NP  fluticasone (FLONASE) 50 MCG/ACT nasal spray Place 1 spray into both nostrils daily.    [provider]  folic acid (FOLVITE) 1 MG tablet Take by mouth. 08/19/16   [provider]  ibuprofen (ADVIL,MOTRIN) 600 MG tablet TAKE 1 TABLET BY MOUTH THREE TIMES DAILY WITH FOOD FOR 7 DAYS THEN TAKE AS NEEDED FOR PAIN 12/01/16   Tresa Garter, MD  LORazepam (ATIVAN) 1 MG tablet Take 1 mg by mouth at bedtime as needed. 03/12/16   [provider]  MELATONIN PO Take 1 tablet by mouth daily.    [provider]  methocarbamol (ROBAXIN) 500 MG tablet TAKE 1 TABLET BY MOUTH 4 TIMES DAILY FOR 7 DAYS  THEN AS NEEDED FOR MUSCLE SPASM. MUST HAVE OFFICE VISIT BEFORE FURTHER REFILLS. 12/09/16   Tresa Garter, MD  methotrexate (RHEUMATREX) 2.5 MG tablet Take by mouth. 08/19/16   [provider]  OLANZapine (ZYPREXA) 20 MG tablet Take by mouth.    [provider]  omeprazole (PRILOSEC) 40 MG capsule Take 1 capsule (40 mg total) by mouth 2 (two) times daily. 02/01/17 02/15/17  Barnet Glasgow, NP  Oxcarbazepine (TRILEPTAL) 300 MG tablet Take 300 mg by mouth at bedtime.    [provider]  predniSONE (DELTASONE) 20 MG tablet Take 1 tablet (20 mg total) by mouth 2 (two) times daily with a meal. Patient not taking: Reported on 02/15/2017 05/12/16   Tresa Garter, MD    Objective:   Vitals:   02/15/17 0902  BP: 135/86  Pulse: 74  Resp: 16  Temp: (!) 97.5 F (36.4 C)  TempSrc: Oral  SpO2: 97%  Weight: 205 lb 9.6 oz (93.3 kg)   Exam General appearance : Awake, alert, not in any distress. Speech Clear. Not toxic looking HEENT: Atraumatic and Normocephalic, pupils equally reactive to light and accomodation Neck: Supple, no JVD. No cervical lymphadenopathy.  Chest: Good air entry bilaterally, no added sounds  CVS: S1 S2 regular, no murmurs.  Abdomen: RUQ tenderness, + Murphy's Sign,Bowel sounds present, not distended with no gaurding, rigidity or rebound. Extremities: B/L Lower Ext  shows no edema, both legs are warm to touch Neurology: Awake alert, and oriented X 3, CN II-XII intact, Non focal Skin: No Rash  Data Review Lab Results  Component Value Date   HGBA1C 5.5 03/08/2016    Assessment & Plan   1. Right upper quadrant pain  - US Abdomen Complete; Future - CMP14+EGFR - Lipase  2. Tobacco use disorder  Bennet was counseled on the dangers of tobacco use, and was advised to quit. Reviewed strategies to maximize success, including removing cigarettes and smoking materials from environment, stress management and support of family/friends.  3.  Schizoaffective disorder, bipolar type ([place])  - Continue current medications - Follow up with psychiatrist and [facility]  Patient have been counseled extensively about nutrition and exercise. Other issues discussed during this visit include: low cholesterol diet, weight control and daily exercise.  Return in about 6 months (around 08/18/2017) for Annual Physical.  The patient was given clear instructions to go to ER or return to medical center if symptoms don't improve, worsen or new problems develop. The patient verbalized understanding. The patient was told to call to get lab results if they haven't heard anything in the next week.   This note has been created with Surveyor, quantity. Any transcriptional errors are unintentional.    Angelica Chessman, MD, [place], [place], [place], [place] and [place], [place]   02/15/2017, 9:30 AM

## 2017-02-15 NOTE — Patient Instructions (Signed)

## 2017-02-16 ENCOUNTER — Ambulatory Visit (HOSPITAL_COMMUNITY)
Admission: RE | Admit: 2017-02-16 | Discharge: 2017-02-16 | Disposition: A | Discharge status: Home / Self Care | Payer: Medicaid Other | Source: Ambulatory Visit | Attending: Internal Medicine | Admitting: Internal Medicine

## 2017-02-16 DIAGNOSIS — R932 Abnormal findings on diagnostic imaging of liver and biliary tract: Secondary | ICD-10-CM | POA: Diagnosis not present

## 2017-02-16 DIAGNOSIS — R1011 Right upper quadrant pain: Secondary | ICD-10-CM | POA: Insufficient documentation

## 2017-02-16 LAB — CMP14+EGFR
ALK PHOS: 111 IU/L (ref 39–117)
ALT: 41 IU/L (ref 0–44)
AST: 28 IU/L (ref 0–40)
Albumin/Globulin Ratio: 1.6 (ref 1.2–2.2)
Albumin: 4.5 g/dL (ref 3.5–5.5)
BUN/Creatinine Ratio: 15 (ref 9–20)
BUN: 12 mg/dL (ref 6–20)
Bilirubin Total: 0.2 mg/dL (ref 0.0–1.2)
CO2: 24 mmol/L (ref 20–29)
CREATININE: 0.78 mg/dL (ref 0.76–1.27)
Calcium: 9.7 mg/dL (ref 8.7–10.2)
Chloride: 100 mmol/L (ref 96–106)
GFR calc Af Amer: 143 mL/min/{1.73_m2} (ref >59)
GFR calc non Af Amer: 124 mL/min/{1.73_m2} (ref >59)
GLUCOSE: 99 mg/dL (ref 65–99)
Globulin, Total: 2.8 g/dL (ref 1.5–4.5)
Potassium: 4.3 mmol/L (ref 3.5–5.2)
Sodium: 140 mmol/L (ref 134–144)
Total Protein: 7.3 g/dL (ref 6.0–8.5)

## 2017-02-16 LAB — LIPASE: LIPASE: 29 U/L (ref 13–78)

## 2017-02-24 ENCOUNTER — Telehealth: Payer: Self-pay

## 2017-02-24 NOTE — Telephone Encounter (Signed)
Contacted pt to go over lab and [place] results pt didn't answer lvm asking pt to give [place] a call at his earliest convenience to go over results  If pt calls back please give results: Lab tests are normal, liver function is normal. Abdominal ultrasound showed possibility of fatty liver, no gallstone, no gall bladder disease. No need for referral to surgery, avoid alcohol, take your medications as prescribed. If pain persists or worsens please report back to the clinic.

## 2017-03-15 ENCOUNTER — Encounter: Payer: Self-pay | Admitting: Internal Medicine

## 2017-03-15 ENCOUNTER — Ambulatory Visit: Payer: Medicaid Other | Attending: Internal Medicine | Admitting: Internal Medicine

## 2017-03-15 VITALS — BP 116/77 | HR 99 | Temp 98.1°F | Resp 18 | Ht 68.0 in | Wt 208.0 lb

## 2017-03-15 DIAGNOSIS — F17200 Nicotine dependence, unspecified, uncomplicated: Secondary | ICD-10-CM

## 2017-03-15 DIAGNOSIS — R1011 Right upper quadrant pain: Secondary | ICD-10-CM | POA: Diagnosis present

## 2017-03-15 DIAGNOSIS — M0608 Rheumatoid arthritis without rheumatoid factor, vertebrae: Secondary | ICD-10-CM

## 2017-03-15 DIAGNOSIS — M47819 Spondylosis without myelopathy or radiculopathy, site unspecified: Secondary | ICD-10-CM | POA: Diagnosis not present

## 2017-03-15 DIAGNOSIS — Z79899 Other long term (current) drug therapy: Secondary | ICD-10-CM | POA: Diagnosis not present

## 2017-03-15 DIAGNOSIS — F17210 Nicotine dependence, cigarettes, uncomplicated: Secondary | ICD-10-CM | POA: Diagnosis not present

## 2017-03-15 DIAGNOSIS — F259 Schizoaffective disorder, unspecified: Secondary | ICD-10-CM | POA: Diagnosis not present

## 2017-03-15 DIAGNOSIS — F319 Bipolar disorder, unspecified: Secondary | ICD-10-CM | POA: Insufficient documentation

## 2017-03-15 NOTE — Progress Notes (Signed)
Kevin Vance, is a 26 y.o. male  YKZ:[id number]  [id number]:[id number]  DOB - [birth year]  Chief Complaint  Patient presents with  . Results       Subjective:   Kevin Vance is a 26 y.o. 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 Pain has since resolved. He has no other complaint today. Abdominal ultrasound showed "Increased echogenicity consistent with fatty infiltration and/or hepatocellular disease. No focal hepatic abnormality identified. No acute abnormality. No gallstones or biliary distention". Discussed result with patient. Patient has No headache, No chest pain, No abdominal pain - No Nausea, No new weakness tingling or numbness, No Cough - SOB.  No problems updated.  ALLERGIES: Allergies  Allergen Reactions  . Oxcarbazepine Palpitations    Patient reports edema with chest pain    PAST MEDICAL HISTORY: Past Medical History:  Diagnosis Date  . Bipolar 1 disorder ([place])   . Schizoaffective disorder ([place])     MEDICATIONS AT HOME: Prior to Admission medications   Medication Sig Start Date End Date Taking? Authorizing Provider  dicyclomine (BENTYL) 20 MG tablet Take 1 tablet (20 mg total) by mouth 2 (two) times daily. 02/01/17  Yes Barnet Glasgow, NP  fluticasone (FLONASE) 50 MCG/ACT nasal spray Place 1 spray into both nostrils daily.   Yes [provider]  folic acid (FOLVITE) 1 MG tablet Take by mouth. 08/19/16  Yes [provider]  ibuprofen (ADVIL,MOTRIN) 600 MG tablet TAKE 1 TABLET BY MOUTH THREE TIMES DAILY WITH FOOD FOR 7 DAYS THEN TAKE AS NEEDED FOR PAIN 12/01/16  Yes Olegario Emberson E, MD  LORazepam (ATIVAN) 1 MG tablet Take 1 mg by mouth at bedtime as needed. 03/12/16  Yes [provider]  MELATONIN PO Take 1 tablet by mouth daily.   Yes [provider]  methocarbamol  (ROBAXIN) 500 MG tablet TAKE 1 TABLET BY MOUTH 4 TIMES DAILY FOR 7 DAYS THEN AS NEEDED FOR MUSCLE SPASM. MUST HAVE OFFICE VISIT BEFORE FURTHER REFILLS. 12/09/16  Yes Tresa Garter, MD  methotrexate (RHEUMATREX) 2.5 MG tablet Take by mouth. 08/19/16  Yes [provider]  OLANZapine (ZYPREXA) 20 MG tablet Take by mouth.   Yes [provider]  omeprazole (PRILOSEC) 40 MG capsule Take 1 capsule (40 mg total) by mouth 2 (two) times daily. 02/01/17 02/15/17  Barnet Glasgow, NP    Objective:   Vitals:   03/15/17 1439  BP: 116/77  Pulse: 99  Resp: 18  Temp: 98.1 F (36.7 C)  TempSrc: Oral  SpO2: 96%  Weight: 208 lb (94.3 kg)  Height: 5\' 8"  (1.727 m)   Exam General appearance : Awake, alert, not in any distress. Speech Clear. Not toxic looking HEENT: Atraumatic and Normocephalic, pupils equally reactive to light and accomodation Neck: Supple, no JVD. No cervical lymphadenopathy.  Chest: Good air entry bilaterally, no added sounds  CVS: S1 S2 regular, no murmurs.  Abdomen: Bowel sounds present, Non tender and not distended with no gaurding, rigidity or rebound. Extremities: B/L Lower Ext shows no edema, both legs are warm to touch Neurology: Awake alert, and oriented X 3, CN II-XII intact, Non focal Skin: No Rash  Data Review Lab Results  Component Value Date   HGBA1C 5.5 03/08/2016    Assessment & Plan   1. Right upper quadrant pain  Abd ultrasound showed "Increased echogenicity consistent with fatty infiltration  and/or hepatocellular disease. No focal hepatic abnormality identified. No acute abnormality.  No gallstones or biliary distention" - Discussed result with patient - Counseled about diet and water intake   2. Tobacco use disorder Kevin Vance was counseled on the dangers of tobacco use, and was advised to quit. Reviewed strategies to maximize success, including removing cigarettes and smoking materials from environment, stress management and support of  family/friends.   3. Seronegative spondyloarthropathy - Currently on MTX  - Continue Follow up with Rheumatologist  Patient have been counseled extensively about nutrition and exercise. Other issues discussed during this visit include: low cholesterol diet, weight control and daily exercise  Return in about 6 months (around 09/14/2017) for Routine Follow Up.  The patient was given clear instructions to go to ER or return to medical center if symptoms don't improve, worsen or new problems develop. The patient verbalized understanding. The patient was told to call to get lab results if they haven't heard anything in the next week.   This note has been created with Surveyor, quantity. Any transcriptional errors are unintentional.    Angelica Chessman, MD, [place], [place], [place], [place] and [place], [place]   03/15/2017, 3:08 PM

## 2017-03-15 NOTE — Patient Instructions (Signed)
Steps to Quit Smoking Smoking tobacco can be bad for your health. It can also affect almost every organ in your body. Smoking puts you and people around you at risk for many serious long-lasting (chronic) diseases. Quitting smoking is hard, but it is one of the best things that you can do for your health. It is never too late to quit. What are the benefits of quitting smoking? When you quit smoking, you lower your risk for getting serious diseases and conditions. They can include:  Lung cancer or lung disease.  Heart disease.  Stroke.  Heart attack.  Not being able to have children (infertility).  Weak bones (osteoporosis) and broken bones (fractures).  If you have coughing, wheezing, and shortness of breath, those symptoms may get better when you quit. You may also get sick less often. If you are pregnant, quitting smoking can help to lower your chances of having a baby of low birth weight. What can I do to help me quit smoking? Talk with your doctor about what can help you quit smoking. Some things you can do (strategies) include:  Quitting smoking totally, instead of slowly cutting back how much you smoke over a period of time.  Going to in-person counseling. You are more likely to quit if you go to many counseling sessions.  Using resources and support systems, such as: ? Online chats with a counselor. ? Phone quitlines. ? Printed self-help materials. ? Support groups or group counseling. ? Text messaging programs. ? Mobile phone apps or applications.  Taking medicines. Some of these medicines may have nicotine in them. If you are pregnant or breastfeeding, do not take any medicines to quit smoking unless your doctor says it is okay. Talk with your doctor about counseling or other things that can help you.  Talk with your doctor about using more than one strategy at the same time, such as taking medicines while you are also going to in-person counseling. This can help make  quitting easier. What things can I do to make it easier to quit? Quitting smoking might feel very hard at first, but there is a lot that you can do to make it easier. Take these steps:  Talk to your family and friends. Ask them to support and encourage you.  Call phone quitlines, reach out to support groups, or work with a counselor.  Ask people who smoke to not smoke around you.  Avoid places that make you want (trigger) to smoke, such as: ? Bars. ? Parties. ? Smoke-break areas at work.  Spend time with people who do not smoke.  Lower the stress in your life. Stress can make you want to smoke. Try these things to help your stress: ? Getting regular exercise. ? Deep-breathing exercises. ? Yoga. ? Meditating. ? Doing a body scan. To do this, close your eyes, focus on one area of your body at a time from head to toe, and notice which parts of your body are tense. Try to relax the muscles in those areas.  Download or buy apps on your mobile phone or tablet that can help you stick to your quit plan. There are many free apps, such as QuitGuide from the CDC (Centers for Disease Control and Prevention). You can find more support from smokefree.gov and other websites.  This information is not intended to replace advice given to you by your health care provider. Make sure you discuss any questions you have with your health care provider. Document Released: 04/30/2009 Document   Revised: 03/01/2016 Document Reviewed: 11/18/2014 Elsevier Interactive Patient Education  2018 [employer]vier Inc.  

## 2017-03-16 ENCOUNTER — Other Ambulatory Visit: Payer: Self-pay | Admitting: Internal Medicine

## 2017-03-16 DIAGNOSIS — M546 Pain in thoracic spine: Secondary | ICD-10-CM

## 2017-03-16 DIAGNOSIS — H6123 Impacted cerumen, bilateral: Secondary | ICD-10-CM

## 2017-06-21 ENCOUNTER — Other Ambulatory Visit: Payer: Self-pay | Admitting: Internal Medicine

## 2017-06-21 DIAGNOSIS — H6123 Impacted cerumen, bilateral: Secondary | ICD-10-CM

## 2017-06-21 DIAGNOSIS — M546 Pain in thoracic spine: Secondary | ICD-10-CM

## 2017-09-01 DIAGNOSIS — L03115 Cellulitis of right lower limb: Secondary | ICD-10-CM

## 2017-09-12 IMAGING — DX DG CHEST 2V
2 series · 2 of 2 positions shown · non-contrast
Comparison: None.

CLINICAL DATA: Chest pain.

EXAM:
CHEST  2 VIEW

[chest lat]
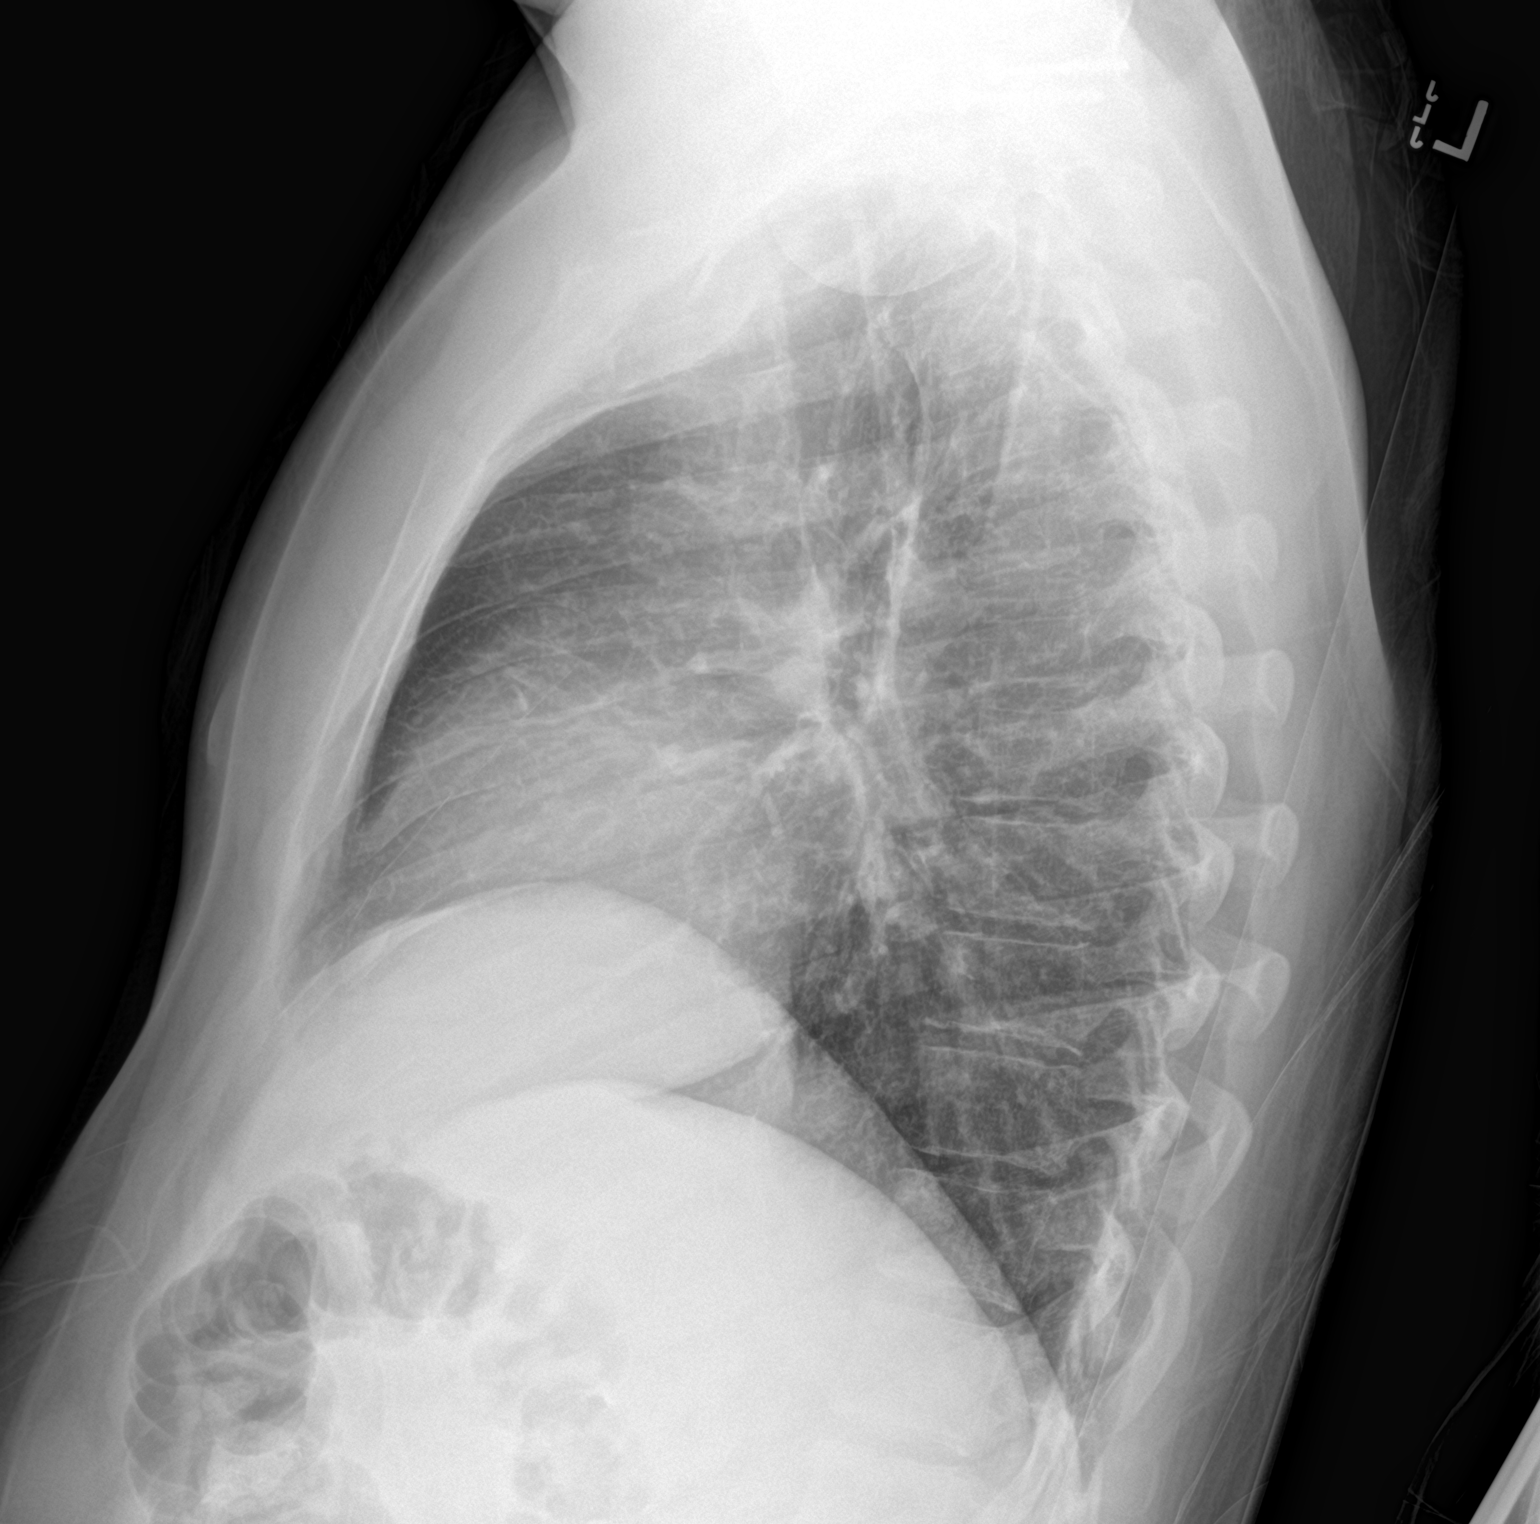

[chest ap w/grid]
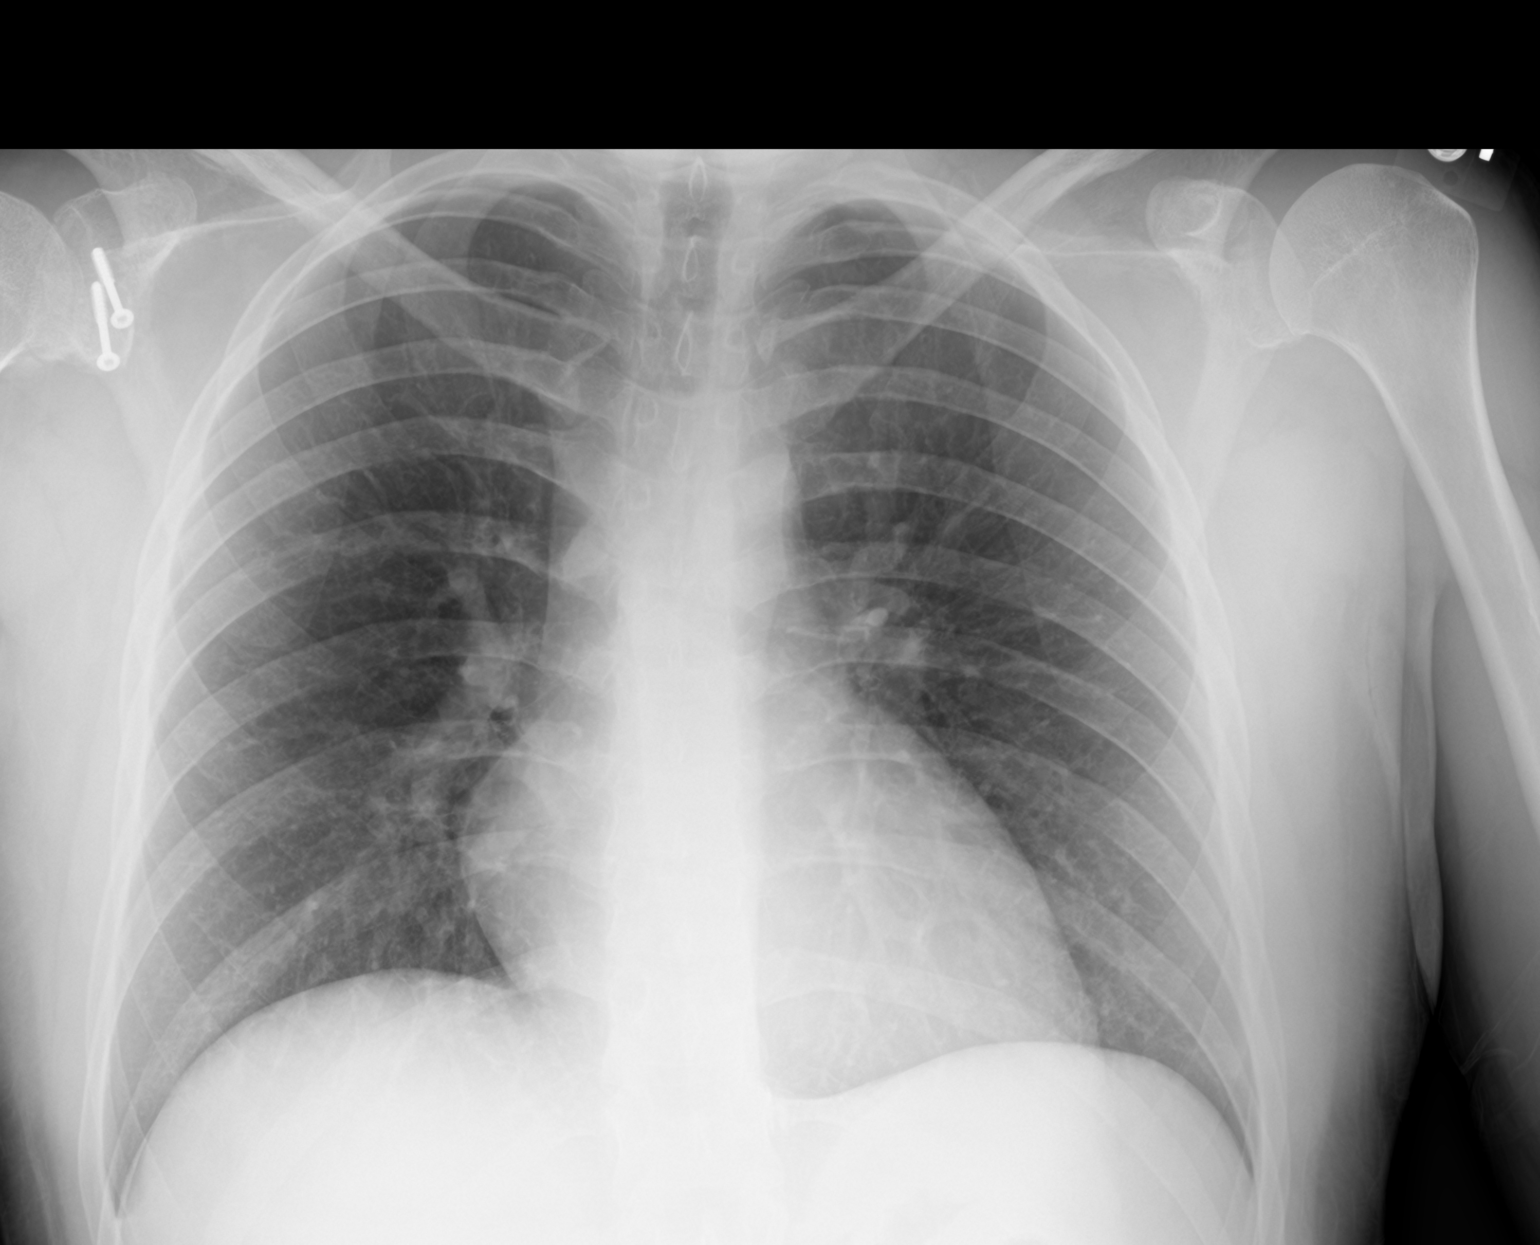

[2 of 2 positions shown; findings below may reference images not displayed]

FINDINGS: The heart size and mediastinal contours are within normal limits.
Both lungs are clear. No pneumothorax or pleural effusion is noted.
The visualized skeletal structures are unremarkable.
IMPRESSION: No active cardiopulmonary disease.

## 2019-03-05 ENCOUNTER — Other Ambulatory Visit: Payer: Self-pay

## 2019-03-05 DIAGNOSIS — Z20822 Contact with and (suspected) exposure to covid-19: Secondary | ICD-10-CM

## 2019-03-06 LAB — NOVEL CORONAVIRUS, NAA: SARS-CoV-2, NAA: NOT DETECTED
